# Patient Record
Sex: Male | Born: 1955
Health system: Southern US, Community
[De-identification: ages and names within clinical notes are randomized; demographics above are authoritative.]

## PROBLEM LIST (undated history)

## (undated) DIAGNOSIS — E119 Type 2 diabetes mellitus without complications: Secondary | ICD-10-CM

## (undated) DIAGNOSIS — G4730 Sleep apnea, unspecified: Secondary | ICD-10-CM

## (undated) DIAGNOSIS — S83289A Other tear of lateral meniscus, current injury, unspecified knee, initial encounter: Secondary | ICD-10-CM

## (undated) DIAGNOSIS — M1990 Unspecified osteoarthritis, unspecified site: Secondary | ICD-10-CM

## (undated) DIAGNOSIS — S83249A Other tear of medial meniscus, current injury, unspecified knee, initial encounter: Secondary | ICD-10-CM

## (undated) DIAGNOSIS — Z972 Presence of dental prosthetic device (complete) (partial): Secondary | ICD-10-CM

## (undated) HISTORY — PX: TONSILLECTOMY: SUR1361

## (undated) HISTORY — DX: Other tear of lateral meniscus, current injury, unspecified knee, initial encounter: S83.289A

## (undated) HISTORY — DX: Other tear of medial meniscus, current injury, unspecified knee, initial encounter: S83.249A

---

## 2000-07-30 HISTORY — PX: NASAL SEPTOPLASTY W/ TURBINOPLASTY: SHX2070

## 2000-11-06 ENCOUNTER — Ambulatory Visit (HOSPITAL_COMMUNITY): Admission: RE | Admit: 2000-11-06 | Discharge: 2000-11-07 | Payer: Self-pay | Admitting: *Deleted

## 2000-11-06 ENCOUNTER — Encounter (INDEPENDENT_AMBULATORY_CARE_PROVIDER_SITE_OTHER): Payer: Self-pay | Admitting: *Deleted

## 2001-01-10 ENCOUNTER — Encounter: Payer: Self-pay | Admitting: *Deleted

## 2001-01-10 ENCOUNTER — Emergency Department (HOSPITAL_COMMUNITY): Admission: EM | Admit: 2001-01-10 | Discharge: 2001-01-10 | Payer: Self-pay | Admitting: *Deleted

## 2001-01-12 ENCOUNTER — Emergency Department (HOSPITAL_COMMUNITY): Admission: EM | Admit: 2001-01-12 | Discharge: 2001-01-12 | Payer: Self-pay | Admitting: Internal Medicine

## 2001-01-21 ENCOUNTER — Emergency Department (HOSPITAL_COMMUNITY): Admission: EM | Admit: 2001-01-21 | Discharge: 2001-01-21 | Payer: Self-pay | Admitting: *Deleted

## 2002-09-15 ENCOUNTER — Emergency Department (HOSPITAL_COMMUNITY): Admission: EM | Admit: 2002-09-15 | Discharge: 2002-09-15 | Payer: Self-pay | Admitting: *Deleted

## 2009-09-22 ENCOUNTER — Ambulatory Visit (HOSPITAL_COMMUNITY): Admission: RE | Admit: 2009-09-22 | Discharge: 2009-09-22 | Payer: Self-pay | Admitting: Internal Medicine

## 2010-12-15 NOTE — Op Note (Signed)
Joshua Hardy. [facility]  Patient:    Joshua Hardy, Joshua Hardy                         MRN: [id number] Proc. Date: 11/06/00 Adm. Date:  [id number] Attending:  Carlena Sax CC:         Barbaraann Share, M.D. [facility]  Dr. Carylon Perches   Operative Report  PREOPERATIVE DIAGNOSIS:  Severe obstructive sleep apnea, nasal airway obstruction, nasal septal deviation and inferior turbinate hypertrophy.  POSTOPERATIVE DIAGNOSIS:  Severe obstructive sleep apnea, nasal airway obstruction, nasal septal deviation and inferior turbinate hypertrophy.  OPERATION PERFORMED:  Nasal septal reconstruction, intramural cauterization of both inferior turbinates.  SURGEON:  Veverly Fells. Arletha Grippe, M.D.  ANESTHESIA:  General endotracheal.  INDICATIONS FOR PROCEDURE:  The patient is a 55 year old white male who gives a long history of nasal airway obstruction, daytime somnolence, morning fatigue, loud snoring at night with apneic episodes as noted by his wife. Inpatient polysomnogram did reveal severe obstructive sleep apnea, RDI 76 events per hour.  He was seen by Dr. Marcelyn Bruins and has undergone nasal CPAP treatment for his severe obstructive sleep apnea.  Unfortunately due to his significant problem with nasal anatomy, he does have difficulty breathing through his nose and is unable to use the nasal CPAP effectively.  Physical examination did show severe shape septal deformity, inferior turbinate congestion and hypertrophy.  Based on his history and physical examination and the above noted anatomical findings, I have recommended proceeding with the above noted surgical procedure.  I have discussed with him extensively, the risks and benefits of surgery including risks of general anesthesia, infection and bleeding, the possibility of septal perforation and the normal recovery period after this type of surgery.  I have entertained any questions, answered them appropriately.  Informed consent was  obtained and the patient presents for the above noted procedure.  OPERATIVE FINDINGS:  A severe S-shaped septal deformity and inferior turbinate congestion and hypertrophy.  DESCRIPTION OF PROCEDURE:  The patient was brought to the operating room and placed in supine position.  General endotracheal anesthesia was administered via the anesthesiologist without complication.  The patient was administered 1 gm of Ancef IV x 1 and 10 mg Decadron IV x 1.  Head of the table was elevated to 30 degrees.  The patients face was draped in standard fashion. Cotton pledgets soaked in a 4% cocaine solution were placed on both nares and left in place for approximately 10 minutes and then removed.  Both sides of the septum were infiltrated with a 1% lidocaine solution with 1:100,000 epinephrine waiting approximately 10 minutes and then a standard Killian incision was made on the left side of the septum.  Mucoperichondrium and mucoperiosteal flap was elevated on the left side using both sharp and blunt dissection.  Next the intercartilaginous incision was made approximately 1 cm posterior from the caudal end of the septum.  The mucoperichondrial and mucoperiosteal flap was elevated on the right side using both blunt and sharp dissection.  Cartilaginous deviation was removed with a Ballenger swivel knife.  The posterior bony deflection was removed with a Jansen-Middleton and the septal spur which was pushing off from the left nasal chamber was removed using a small osteotome.  A piece of trimmed morselized cartilage was placed in between the septal flaps.  The septal incision was closed with interrupted chromic suture and the septum was reinforced with a running transeptal plain gut mattress stitch.  Both  inferior turbinates were injected with a total of 6 cc of a 1% lidocaine solution with 1:100,000 epinephrine.  Both inferior turbinates were then intramurally cauterized using Elmed  intramural cauterization unit set on a 12 watt setting.  Three passes both inferior turbinates were performed without difficulty and both inferior turbinates were then outfractured using gentle lateral pressure with a large nasal speculum. Doyle splints soaked in a Bactroban ointment solution were placed on either side of the septum and held in place with a transeptal Prolene suture.  The oral cavity was suctioned dry.  There was no evidence of any active bleeding. Fluids given during the procedure were approximately 1L crystalloid. Estimated blood loss was less than 50 cc.  Urine output not measured.  No drains.  The above noted two Doyle splints were placed.  Specimens sent were septal cartilage and bone for gross pathology only.  The patient tolerated the procedure well without complications and was extubated in the operating room and transferred to the recovery room in stable condition.  Sponge and instrument counts were correct at the end of the procedure. Total duration of the procedure was approximately one hour.  The patient would be admitted overnight in a monitored situation.  If he has recovered well, he will be sent home on November 07, 2000.  He will be sent home on some Keflex 500 mg p.o. t.i.d. for 10 days and Vicodin #30 with two refills 1 to 2 tabs p.o. q.4h. p.r.n. pain.  He is to have light activity, no heavy lifting or nose blowing for two weeks after surgery.  Both he and his family were given oral and written instructions.  They are to call with any problems with bleeding, fever, vomiting, pain or reaction to medications.  He will follow up in the office for splint removal on Wednesday April 17 at 2:25 p.m. DD:  11/06/00 TD:  11/06/00 Job: [id number]

## 2013-06-24 ENCOUNTER — Other Ambulatory Visit (HOSPITAL_COMMUNITY): Payer: Self-pay | Admitting: Orthopedic Surgery

## 2013-06-24 DIAGNOSIS — M25562 Pain in left knee: Secondary | ICD-10-CM

## 2013-06-29 ENCOUNTER — Ambulatory Visit (HOSPITAL_COMMUNITY)
Admission: RE | Admit: 2013-06-29 | Discharge: 2013-06-29 | Disposition: A | Discharge status: Home / Self Care | Payer: PRIVATE HEALTH INSURANCE | Source: Ambulatory Visit | Attending: Orthopedic Surgery | Admitting: Orthopedic Surgery

## 2013-06-29 ENCOUNTER — Other Ambulatory Visit (HOSPITAL_COMMUNITY): Payer: Self-pay | Admitting: Orthopedic Surgery

## 2013-06-29 DIAGNOSIS — M25469 Effusion, unspecified knee: Secondary | ICD-10-CM | POA: Insufficient documentation

## 2013-06-29 DIAGNOSIS — M25562 Pain in left knee: Secondary | ICD-10-CM

## 2013-06-29 DIAGNOSIS — M25569 Pain in unspecified knee: Secondary | ICD-10-CM | POA: Insufficient documentation

## 2013-06-29 DIAGNOSIS — M6740 Ganglion, unspecified site: Secondary | ICD-10-CM | POA: Insufficient documentation

## 2013-06-29 DIAGNOSIS — M2240 Chondromalacia patellae, unspecified knee: Secondary | ICD-10-CM | POA: Insufficient documentation

## 2013-06-29 DIAGNOSIS — M23329 Other meniscus derangements, posterior horn of medial meniscus, unspecified knee: Secondary | ICD-10-CM | POA: Insufficient documentation

## 2013-08-04 ENCOUNTER — Encounter (HOSPITAL_BASED_OUTPATIENT_CLINIC_OR_DEPARTMENT_OTHER): Payer: Self-pay | Admitting: *Deleted

## 2013-08-04 NOTE — Progress Notes (Signed)
Had corrective surgery for sleep apnea-states he rarely snores, no apnea-denies any resp or cardiac problems

## 2013-08-04 NOTE — Progress Notes (Signed)
08/04/13 0932  OBSTRUCTIVE SLEEP APNEA  Have you ever been diagnosed with sleep apnea through a sleep study? Yes  If yes, do you have and use a CPAP or BPAP machine every night? 0 (had surgery)  Do you snore loudly (loud enough to be heard through closed doors)?  0  Do you often feel tired, fatigued, or sleepy during the daytime? 0  Has anyone observed you stop breathing during your sleep? 0  Do you have, or are you being treated for high blood pressure? 0  BMI more than 35 kg/m2? 1  Age over 56 years old? 1  Neck circumference greater than 40 cm/18 inches? 1  Gender: 1  Obstructive Sleep Apnea Score 4  Score 4 or greater  Results sent to PCP

## 2013-08-05 ENCOUNTER — Encounter (HOSPITAL_BASED_OUTPATIENT_CLINIC_OR_DEPARTMENT_OTHER)
Admission: RE | Disposition: A | Discharge status: Home / Self Care | Payer: Self-pay | Source: Ambulatory Visit | Attending: Orthopedic Surgery

## 2013-08-05 ENCOUNTER — Encounter: Payer: Self-pay | Admitting: Physician Assistant

## 2013-08-05 ENCOUNTER — Other Ambulatory Visit: Payer: Self-pay | Admitting: Physician Assistant

## 2013-08-05 ENCOUNTER — Encounter (HOSPITAL_BASED_OUTPATIENT_CLINIC_OR_DEPARTMENT_OTHER): Payer: Self-pay | Admitting: *Deleted

## 2013-08-05 ENCOUNTER — Ambulatory Visit (HOSPITAL_BASED_OUTPATIENT_CLINIC_OR_DEPARTMENT_OTHER): Payer: PRIVATE HEALTH INSURANCE | Admitting: *Deleted

## 2013-08-05 ENCOUNTER — Ambulatory Visit (HOSPITAL_BASED_OUTPATIENT_CLINIC_OR_DEPARTMENT_OTHER)
Admission: RE | Admit: 2013-08-05 | Discharge: 2013-08-06 | Disposition: A | Discharge status: Home / Self Care | Payer: PRIVATE HEALTH INSURANCE | Source: Ambulatory Visit | Attending: Orthopedic Surgery | Admitting: Orthopedic Surgery

## 2013-08-05 ENCOUNTER — Encounter (HOSPITAL_BASED_OUTPATIENT_CLINIC_OR_DEPARTMENT_OTHER): Payer: PRIVATE HEALTH INSURANCE | Admitting: *Deleted

## 2013-08-05 DIAGNOSIS — S83249A Other tear of medial meniscus, current injury, unspecified knee, initial encounter: Secondary | ICD-10-CM

## 2013-08-05 DIAGNOSIS — S83242D Other tear of medial meniscus, current injury, left knee, subsequent encounter: Secondary | ICD-10-CM

## 2013-08-05 DIAGNOSIS — S83289A Other tear of lateral meniscus, current injury, unspecified knee, initial encounter: Secondary | ICD-10-CM | POA: Insufficient documentation

## 2013-08-05 DIAGNOSIS — Z87891 Personal history of nicotine dependence: Secondary | ICD-10-CM | POA: Insufficient documentation

## 2013-08-05 DIAGNOSIS — G4730 Sleep apnea, unspecified: Secondary | ICD-10-CM

## 2013-08-05 DIAGNOSIS — M23329 Other meniscus derangements, posterior horn of medial meniscus, unspecified knee: Secondary | ICD-10-CM | POA: Insufficient documentation

## 2013-08-05 DIAGNOSIS — S83282D Other tear of lateral meniscus, current injury, left knee, subsequent encounter: Secondary | ICD-10-CM

## 2013-08-05 DIAGNOSIS — M23359 Other meniscus derangements, posterior horn of lateral meniscus, unspecified knee: Secondary | ICD-10-CM | POA: Insufficient documentation

## 2013-08-05 DIAGNOSIS — M9420 Chondromalacia, unspecified site: Secondary | ICD-10-CM | POA: Insufficient documentation

## 2013-08-05 DIAGNOSIS — Z972 Presence of dental prosthetic device (complete) (partial): Secondary | ICD-10-CM

## 2013-08-05 HISTORY — PX: KNEE ARTHROSCOPY WITH MEDIAL MENISECTOMY: SHX5651

## 2013-08-05 HISTORY — DX: Presence of dental prosthetic device (complete) (partial): Z97.2

## 2013-08-05 HISTORY — DX: Sleep apnea, unspecified: G47.30

## 2013-08-05 LAB — POCT HEMOGLOBIN-HEMACUE: HEMOGLOBIN: 13.9 g/dL (ref 13.0–17.0)

## 2013-08-05 SURGERY — ARTHROSCOPY, KNEE, WITH MEDIAL MENISCECTOMY
Anesthesia: Regional | Site: Knee | Laterality: Left

## 2013-08-05 MED ORDER — DEXTROSE 5 % IV SOLN
3.0000 g | INTRAVENOUS | Status: DC | PRN
  Administered 2013-08-05: 3 g via INTRAVENOUS

## 2013-08-05 MED ORDER — BUPIVACAINE-EPINEPHRINE PF 0.25-1:200000 % IJ SOLN
INTRAMUSCULAR | Status: AC
  Filled 2013-08-05: qty 30

## 2013-08-05 MED ORDER — DEXAMETHASONE SODIUM PHOSPHATE 10 MG/ML IJ SOLN
INTRAMUSCULAR | Status: DC | PRN
  Administered 2013-08-05: 10 mg via INTRAVENOUS

## 2013-08-05 MED ORDER — HYDROMORPHONE HCL PF 1 MG/ML IJ SOLN: 0.5000 mg | INTRAMUSCULAR | Status: DC | PRN

## 2013-08-05 MED ORDER — EPINEPHRINE HCL 1 MG/ML IJ SOLN
INTRAMUSCULAR | Status: DC | PRN
  Administered 2013-08-05: 13:00:00

## 2013-08-05 MED ORDER — EPINEPHRINE HCL 1 MG/ML IJ SOLN
INTRAMUSCULAR | Status: AC
  Filled 2013-08-05: qty 1

## 2013-08-05 MED ORDER — METOCLOPRAMIDE HCL 5 MG/ML IJ SOLN: 5.0000 mg | Freq: Three times a day (TID) | INTRAMUSCULAR | Status: DC | PRN

## 2013-08-05 MED ORDER — MIDAZOLAM HCL 2 MG/2ML IJ SOLN
INTRAMUSCULAR | Status: AC
  Filled 2013-08-05: qty 2

## 2013-08-05 MED ORDER — PROPOFOL 10 MG/ML IV BOLUS
INTRAVENOUS | Status: DC | PRN
  Administered 2013-08-05: 300 mg via INTRAVENOUS

## 2013-08-05 MED ORDER — SODIUM CHLORIDE 0.9 % IV SOLN
INTRAVENOUS | Status: DC
  Administered 2013-08-05: 16:00:00 via INTRAVENOUS

## 2013-08-05 MED ORDER — CEFAZOLIN SODIUM-DEXTROSE 2-3 GM-% IV SOLR
2.0000 g | Freq: Four times a day (QID) | INTRAVENOUS | Status: AC
  Administered 2013-08-05 – 2013-08-06 (×3): 2 g via INTRAVENOUS

## 2013-08-05 MED ORDER — OXYCODONE HCL 5 MG PO TABS: 5.0000 mg | ORAL_TABLET | Freq: Once | ORAL | Status: DC | PRN

## 2013-08-05 MED ORDER — CEFAZOLIN SODIUM 1-5 GM-% IV SOLN
INTRAVENOUS | Status: AC
  Filled 2013-08-05: qty 150

## 2013-08-05 MED ORDER — PROPOFOL 10 MG/ML IV EMUL
INTRAVENOUS | Status: AC
  Filled 2013-08-05: qty 50

## 2013-08-05 MED ORDER — METOCLOPRAMIDE HCL 5 MG PO TABS: 5.0000 mg | ORAL_TABLET | Freq: Three times a day (TID) | ORAL | Status: DC | PRN

## 2013-08-05 MED ORDER — ONDANSETRON HCL 4 MG/2ML IJ SOLN
4.0000 mg | Freq: Four times a day (QID) | INTRAMUSCULAR | Status: DC | PRN
Start: 2013-08-05 — End: 2013-08-06

## 2013-08-05 MED ORDER — HYDROCODONE-ACETAMINOPHEN 5-325 MG PO TABS: 1.0000 | ORAL_TABLET | ORAL | Status: DC | PRN

## 2013-08-05 MED ORDER — ONDANSETRON HCL 4 MG PO TABS
4.0000 mg | ORAL_TABLET | Freq: Four times a day (QID) | ORAL | Status: DC | PRN
Start: 2013-08-05 — End: 2013-08-06

## 2013-08-05 MED ORDER — MIDAZOLAM HCL 2 MG/2ML IJ SOLN
1.0000 mg | INTRAMUSCULAR | Status: DC | PRN
  Administered 2013-08-05: 2 mg via INTRAVENOUS

## 2013-08-05 MED ORDER — LACTATED RINGERS IV SOLN
INTRAVENOUS | Status: DC
  Administered 2013-08-05: 10:00:00 via INTRAVENOUS

## 2013-08-05 MED ORDER — HYDROMORPHONE HCL PF 1 MG/ML IJ SOLN
0.2500 mg | INTRAMUSCULAR | Status: DC | PRN
  Administered 2013-08-05 (×3): 0.5 mg via INTRAVENOUS
  Filled 2013-08-05 (×2): qty 1

## 2013-08-05 MED ORDER — FENTANYL CITRATE 0.05 MG/ML IJ SOLN
INTRAMUSCULAR | Status: AC
  Filled 2013-08-05: qty 4

## 2013-08-05 MED ORDER — ONDANSETRON HCL 4 MG/2ML IJ SOLN
INTRAMUSCULAR | Status: DC | PRN
  Administered 2013-08-05: 4 mg via INTRAVENOUS

## 2013-08-05 MED ORDER — CEFAZOLIN SODIUM 1-5 GM-% IV SOLN
INTRAVENOUS | Status: AC
  Filled 2013-08-05: qty 100

## 2013-08-05 MED ORDER — FENTANYL CITRATE 0.05 MG/ML IJ SOLN
INTRAMUSCULAR | Status: AC
  Filled 2013-08-05: qty 2

## 2013-08-05 MED ORDER — HYDROCODONE-ACETAMINOPHEN 5-325 MG PO TABS: ORAL_TABLET | ORAL | Status: DC

## 2013-08-05 MED ORDER — FENTANYL CITRATE 0.05 MG/ML IJ SOLN
50.0000 ug | INTRAMUSCULAR | Status: DC | PRN
  Administered 2013-08-05: 100 ug via INTRAVENOUS

## 2013-08-05 MED ORDER — LIDOCAINE HCL (CARDIAC) 20 MG/ML IV SOLN
INTRAVENOUS | Status: DC | PRN
  Administered 2013-08-05: 100 mg via INTRAVENOUS

## 2013-08-05 MED ORDER — CEFAZOLIN SODIUM 1-5 GM-% IV SOLN
INTRAVENOUS | Status: AC
  Filled 2013-08-05: qty 50

## 2013-08-05 MED ORDER — OXYCODONE HCL 5 MG/5ML PO SOLN: 5.0000 mg | Freq: Once | ORAL | Status: DC | PRN

## 2013-08-05 MED ORDER — METOCLOPRAMIDE HCL 5 MG/ML IJ SOLN: 10.0000 mg | Freq: Once | INTRAMUSCULAR | Status: DC | PRN

## 2013-08-05 SURGICAL SUPPLY — 43 items
BANDAGE ELASTIC 6 VELCRO ST LF (GAUZE/BANDAGES/DRESSINGS) ×2 IMPLANT
BLADE CUTTER GATOR 3.5 (BLADE) ×2 IMPLANT
BLADE GREAT WHITE 4.2 (BLADE) IMPLANT
BLADE SURG 15 STRL LF DISP TIS (BLADE) IMPLANT
BLADE SURG 15 STRL SS (BLADE)
BNDG COHESIVE 4X5 TAN STRL (GAUZE/BANDAGES/DRESSINGS) ×2 IMPLANT
CANISTER SUCT 3000ML (MISCELLANEOUS) IMPLANT
CANISTER SUCT LVC 12 LTR MEDI- (MISCELLANEOUS) IMPLANT
DRAPE ARTHROSCOPY W/POUCH 90 (DRAPES) ×2 IMPLANT
DURAPREP 26ML APPLICATOR (WOUND CARE) ×2 IMPLANT
GAUZE XEROFORM 1X8 LF (GAUZE/BANDAGES/DRESSINGS) ×2 IMPLANT
GLOVE BIO SURGEON STRL SZ7 (GLOVE) ×6 IMPLANT
GLOVE BIOGEL PI IND STRL 7.0 (GLOVE) ×2 IMPLANT
GLOVE BIOGEL PI IND STRL 7.5 (GLOVE) ×1 IMPLANT
GLOVE BIOGEL PI INDICATOR 7.0 (GLOVE) ×2
GLOVE BIOGEL PI INDICATOR 7.5 (GLOVE) ×1
GLOVE EXAM NITRILE MD LF STRL (GLOVE) ×2 IMPLANT
GLOVE SS BIOGEL STRL SZ 7.5 (GLOVE) ×1 IMPLANT
GLOVE SUPERSENSE BIOGEL SZ 7.5 (GLOVE) ×1
GOWN STRL REUS W/ TWL LRG LVL3 (GOWN DISPOSABLE) ×3 IMPLANT
GOWN STRL REUS W/TWL 2XL LVL3 (GOWN DISPOSABLE) ×2 IMPLANT
GOWN STRL REUS W/TWL LRG LVL3 (GOWN DISPOSABLE) ×3
HOLDER KNEE FOAM BLUE (MISCELLANEOUS) ×2 IMPLANT
KNEE WRAP E Z 3 GEL PACK (MISCELLANEOUS) ×2 IMPLANT
NDL SAFETY ECLIPSE 18X1.5 (NEEDLE) ×2 IMPLANT
NEEDLE HYPO 18GX1.5 SHARP (NEEDLE) ×2
NEEDLE HYPO 22GX1.5 SAFETY (NEEDLE) IMPLANT
PACK ARTHROSCOPY DSU (CUSTOM PROCEDURE TRAY) ×2 IMPLANT
PACK BASIN DAY SURGERY FS (CUSTOM PROCEDURE TRAY) ×2 IMPLANT
PAD ALCOHOL SWAB (MISCELLANEOUS) IMPLANT
SET ARTHROSCOPY TUBING (MISCELLANEOUS) ×1
SET ARTHROSCOPY TUBING LN (MISCELLANEOUS) ×1 IMPLANT
SPONGE GAUZE 4X4 12PLY (GAUZE/BANDAGES/DRESSINGS) ×2 IMPLANT
SUCTION FRAZIER TIP 10 FR DISP (SUCTIONS) IMPLANT
SUT ETHILON 4 0 PS 2 18 (SUTURE) ×2 IMPLANT
SUT PROLENE 3 0 PS 2 (SUTURE) IMPLANT
SUT VIC AB 3-0 PS1 18 (SUTURE) ×1
SUT VIC AB 3-0 PS1 18XBRD (SUTURE) ×1 IMPLANT
SYR 20CC LL (SYRINGE) IMPLANT
SYR 5ML LL (SYRINGE) ×2 IMPLANT
TOWEL OR 17X24 6PK STRL BLUE (TOWEL DISPOSABLE) ×2 IMPLANT
WAND STAR VAC 90 (SURGICAL WAND) IMPLANT
WATER STERILE IRR 1000ML POUR (IV SOLUTION) ×2 IMPLANT

## 2013-08-05 NOTE — Transfer of Care (Signed)
Immediate Anesthesia Transfer of Care Note  Patient: Joshua Hardy  Procedure(s) Performed: Procedure(s): LEFT KNEE ARTHROSCOPY WITH MEDIAL AND LATERAL MENISECTOMY (Left)  Patient Location: PACU  Anesthesia Type:GA combined with regional for post-op pain  Level of Consciousness: awake, alert  and oriented  Airway & Oxygen Therapy: Patient Spontanous Breathing and Patient connected to face mask oxygen  Post-op Assessment: Report given to PACU RN, Post -op Vital signs reviewed and stable and Patient moving all extremities  Post vital signs: Reviewed and stable  Complications: No apparent anesthesia complications

## 2013-08-05 NOTE — Anesthesia Preprocedure Evaluation (Signed)
Anesthesia Evaluation  Patient identified by MRN, date of birth, ID band Patient awake    Reviewed: Allergy & Precautions, H&P , NPO status , Patient's Chart, lab work & pertinent test results, reviewed documented beta blocker date and time   Airway Mallampati: II TM Distance: >3 FB Neck ROM: full    Dental   Pulmonary sleep apnea , former smoker,  breath sounds clear to auscultation        Cardiovascular negative cardio ROS  Rhythm:regular     Neuro/Psych negative neurological ROS  negative psych ROS   GI/Hepatic negative GI ROS, Neg liver ROS,   Endo/Other  Morbid obesity  Renal/GU negative Renal ROS  negative genitourinary   Musculoskeletal   Abdominal   Peds  Hematology negative hematology ROS (+)   Anesthesia Other Findings See surgeon's H&P   Reproductive/Obstetrics negative OB ROS                           Anesthesia Physical Anesthesia Plan  ASA: III  Anesthesia Plan: General   Post-op Pain Management:    Induction: Intravenous  Airway Management Planned: LMA  Additional Equipment:   Intra-op Plan:   Post-operative Plan:   Informed Consent: I have reviewed the patients History and Physical, chart, labs and discussed the procedure including the risks, benefits and alternatives for the proposed anesthesia with the patient or authorized representative who has indicated his/her understanding and acceptance.   Dental Advisory Given  Plan Discussed with: CRNA and Surgeon  Anesthesia Plan Comments:         Anesthesia Quick Evaluation

## 2013-08-05 NOTE — Progress Notes (Signed)
Assisted Dr. Gelene MinkFrederick with left, knee block. Side rails up, monitors on throughout procedure. See vital signs in flow sheet. Tolerated Procedure well.

## 2013-08-05 NOTE — Anesthesia Postprocedure Evaluation (Signed)
  Anesthesia Post-op Note  Patient: Joshua Hardy  Procedure(s) Performed: Procedure(s): LEFT KNEE ARTHROSCOPY WITH MEDIAL AND LATERAL MENISECTOMY (Left)  Patient Location: PACU  Anesthesia Type:General  Level of Consciousness: awake  Airway and Oxygen Therapy: Patient Spontanous Breathing and Patient connected to nasal cannula oxygen  Post-op Pain: mild  Post-op Assessment: Post-op Vital signs reviewed, Patient's Cardiovascular Status Stable, RESPIRATORY FUNCTION UNSTABLE, Patent Airway, No signs of Nausea or vomiting, Adequate PO intake and Pain level controlled  Post-op Vital Signs: Reviewed and stable  Complications: No apparent anesthesia complications and respiratory complications  Patient has h/o OSA that was surgically treated but still demonstrates s/sx of significant OSA with desaturations below 80 when resting. Will admit for supplemental oxygen tx and have CPAP initiated. It is possible that he may be able to be d/c later tonight but for now needs SAO2 monitoring. CFrederick, MD

## 2013-08-05 NOTE — Discharge Instructions (Signed)
Regional Anesthesia Blocks ° °1. Numbness or the inability to move the "blocked" extremity may last from 3-48 hours after placement. The length of time depends on the medication injected and your individual response to the medication. If the numbness is not going away after 48 hours, call your surgeon. ° °2. The extremity that is blocked will need to be protected until the numbness is gone and the  Strength has returned. Because you cannot feel it, you will need to take extra care to avoid injury. Because it may be weak, you may have difficulty moving it or using it. You may not know what position it is in without looking at it while the block is in effect. ° °3. For blocks in the legs and feet, returning to weight bearing and walking needs to be done carefully. You will need to wait until the numbness is entirely gone and the strength has returned. You should be able to move your leg and foot normally before you try and bear weight or walk. You will need someone to be with you when you first try to ensure you do not fall and possibly risk injury. ° °4. Bruising and tenderness at the needle site are common side effects and will resolve in a few days. ° °5. Persistent numbness or new problems with movement should be communicated to the surgeon or the [facility] ([phone])/ [facility] ([phone]). ° ° ° °Post Anesthesia Home Care Instructions ° °Activity: °Get plenty of rest for the remainder of the day. A responsible adult should stay with you for 24 hours following the procedure.  °For the next 24 hours, DO NOT: °-Drive a car °-Operate machinery °-Drink alcoholic beverages °-Take any medication unless instructed by your physician °-Make any legal decisions or sign important papers. ° °Meals: °Start with liquid foods such as gelatin or soup. Progress to regular foods as tolerated. Avoid greasy, spicy, heavy foods. If nausea and/or vomiting occur, drink only clear liquids until the  nausea and/or vomiting subsides. Call your physician if vomiting continues. ° °Special Instructions/Symptoms: °Your throat may feel dry or sore from the anesthesia or the breathing tube placed in your throat during surgery. If this causes discomfort, gargle with warm salt water. The discomfort should disappear within 24 hours. ° °

## 2013-08-05 NOTE — H&P (Signed)
Joshua RyderMark A Hardy is an 58 y.o. male.   Chief Complaint: left knee pin HPI: 57 yom twisted his knee a few weeks ago.  He felt a pop and has had pain ever since.  He has failed an intra-articular cortisone injection and has an MRI that confirms medial and lateral meniscus tears.    Past Medical History  Diagnosis Date  . Sleep apnea     did use cpap-had corrective surgery 02-no more apnea  . Wears partial dentures     top partial  . Medial meniscus tear   . Lateral meniscus tear     Past Surgical History  Procedure Laterality Date  . Nasal septoplasty w/ turbinoplasty  2002  . Tonsillectomy      No family history on file. Social History:  reports that he quit smoking about 12 years ago. He does not have any smokeless tobacco history on file. He reports that he drinks alcohol. He reports that he does not use illicit drugs.  Allergies: No Known Allergies  Meds:   NONE  (Not in a hospital admission)  Results for orders placed during the hospital encounter of 08/05/13 (from the past 48 hour(s))  POCT HEMOGLOBIN-HEMACUE     Status: None   Collection Time    08/05/13 10:30 AM      Result Value Range   Hemoglobin 13.9  13.0 - 17.0 g/dL   No results found.  Review of Systems  Constitutional: Negative.   HENT: Negative.   Eyes: Negative.   Respiratory: Negative.   Cardiovascular: Negative.   Gastrointestinal: Negative.   Genitourinary: Negative.   Musculoskeletal: Positive for joint pain.       Left knee pain  Skin: Negative.   Neurological: Negative.   Psychiatric/Behavioral: Negative.      Physical Exam  Constitutional: He is oriented to person, place, and time. He appears well-developed and well-nourished.  HENT:  Head: Normocephalic and atraumatic.  Eyes: Conjunctivae are normal. Pupils are equal, round, and reactive to light.  Neck: Neck supple.  Cardiovascular: Normal rate.   Respiratory: Effort normal.  GI: Soft.  Genitourinary:  Not pertinent to current  symptomatology therefore not examined.  Neurological: He is alert and oriented to person, place, and time.  Skin: Skin is warm and dry.  Psychiatric: He has a normal mood and affect. His behavior is normal.     Assessment Patient Active Problem List   Diagnosis Date Noted  . Sleep apnea   . Wears partial dentures   . Medial meniscus tear   . Lateral meniscus tear    Plan Left knee arthroscopy with partial medial and lateral meniscectomies.  The risks, benefits, and possible complications of the procedure were discussed in detail with the patient.  The patient is without question.  Marcanthony Sleight J 08/05/2013, 11:04 AM

## 2013-08-05 NOTE — H&P (View-Only) (Signed)
Joshua Hardy is an 58 y.o. male.   Chief Complaint: left knee pin HPI: 58 yom twisted his knee a few weeks ago.  He felt a pop and has had pain ever since.  He has failed an intra-articular cortisone injection and has an MRI that confirms medial and lateral meniscus tears.    Past Medical History  Diagnosis Date  . Sleep apnea     did use cpap-had corrective surgery 02-no more apnea  . Wears partial dentures     top partial  . Medial meniscus tear   . Lateral meniscus tear     Past Surgical History  Procedure Laterality Date  . Nasal septoplasty w/ turbinoplasty  2002  . Tonsillectomy      No family history on file. Social History:  reports that he quit smoking about 12 years ago. He does not have any smokeless tobacco history on file. He reports that he drinks alcohol. He reports that he does not use illicit drugs.  Allergies: No Known Allergies  Meds:   NONE  (Not in a hospital admission)  Results for orders placed during the hospital encounter of 08/05/13 (from the past 48 hour(s))  POCT HEMOGLOBIN-HEMACUE     Status: None   Collection Time    08/05/13 10:30 AM      Result Value Range   Hemoglobin 13.9  13.0 - 17.0 g/dL   No results found.  Review of Systems  Constitutional: Negative.   HENT: Negative.   Eyes: Negative.   Respiratory: Negative.   Cardiovascular: Negative.   Gastrointestinal: Negative.   Genitourinary: Negative.   Musculoskeletal: Positive for joint pain.       Left knee pain  Skin: Negative.   Neurological: Negative.   Psychiatric/Behavioral: Negative.      Physical Exam  Constitutional: He is oriented to person, place, and time. He appears well-developed and well-nourished.  HENT:  Head: Normocephalic and atraumatic.  Eyes: Conjunctivae are normal. Pupils are equal, round, and reactive to light.  Neck: Neck supple.  Cardiovascular: Normal rate.   Respiratory: Effort normal.  GI: Soft.  Genitourinary:  Not pertinent to current  symptomatology therefore not examined.  Neurological: He is alert and oriented to person, place, and time.  Skin: Skin is warm and dry.  Psychiatric: He has a normal mood and affect. His behavior is normal.     Assessment Patient Active Problem List   Diagnosis Date Noted  . Sleep apnea   . Wears partial dentures   . Medial meniscus tear   . Lateral meniscus tear    Plan Left knee arthroscopy with partial medial and lateral meniscectomies.  The risks, benefits, and possible complications of the procedure were discussed in detail with the patient.  The patient is without question.  Katalaya Beel J 08/05/2013, 11:04 AM    

## 2013-08-05 NOTE — Anesthesia Postprocedure Evaluation (Deleted)
Anesthesia Post Note  Patient: Joshua Hardy  Procedure(s) Performed: Procedure(s) (LRB): LEFT KNEE ARTHROSCOPY WITH MEDIAL AND LATERAL MENISECTOMY (Left)  Anesthesia type: General  Patient location: PACU  Post pain: Pain level controlled  Post assessment: Patient's Cardiovascular Status Stable  Last Vitals:  Filed Vitals:   08/05/13 1345  BP: 100/58  Pulse: 61  Temp:   Resp: 17    Post vital signs: Reviewed and stable  Level of consciousness: alert  Complications: No apparent anesthesia complications

## 2013-08-05 NOTE — Anesthesia Procedure Notes (Signed)
Procedure Name: LMA Insertion Date/Time: 08/05/2013 12:20 PM Performed by: Salvatore MarvelWAINER, ROBERT A Pre-anesthesia Checklist: Patient identified, Emergency Drugs available, Suction available and Patient being monitored Patient Re-evaluated:Patient Re-evaluated prior to inductionOxygen Delivery Method: Circle System Utilized Preoxygenation: Pre-oxygenation with 100% oxygen Intubation Type: IV induction Ventilation: Mask ventilation without difficulty LMA: LMA inserted LMA Size: 5.0 Number of attempts: 1 Airway Equipment and Method: bite block Placement Confirmation: positive ETCO2 and breath sounds checked- equal and bilateral Tube secured with: Tape Dental Injury: Teeth and Oropharynx as per pre-operative assessment

## 2013-08-05 NOTE — Interval H&P Note (Signed)
History and Physical Interval Note:  08/05/2013 12:05 PM  Joshua Hardy  has presented today for surgery, with the diagnosis of Left knee medial and lateral mensicus tear  The various methods of treatment have been discussed with the patient and family. After consideration of risks, benefits and other options for treatment, the patient has consented to  Procedure(s): LEFT KNEE ARTHROSCOPY WITH MEDIAL AND LATERAL MENISECTOMY (Left) as a surgical intervention .  The patient's history has been reviewed, patient examined, no change in status, stable for surgery.  I have reviewed the patient's chart and labs.  Questions were answered to the patient's satisfaction.     Salvatore MarvelWAINER,Lariyah Shetterly A

## 2013-08-06 ENCOUNTER — Encounter (HOSPITAL_BASED_OUTPATIENT_CLINIC_OR_DEPARTMENT_OTHER): Payer: Self-pay | Admitting: Orthopedic Surgery

## 2013-08-06 MED ORDER — CEFAZOLIN SODIUM 1-5 GM-% IV SOLN
INTRAVENOUS | Status: AC
  Filled 2013-08-06: qty 100

## 2013-08-06 NOTE — Progress Notes (Signed)
0510: Pt continues to sleep soundly with snoring & drops of O2 sats to 90-94%. Has another episode of O2 sat dropping to 79% with immediate recovery to 94%.

## 2013-08-06 NOTE — Progress Notes (Signed)
Monitor continues to beep with low sat warnings, and sats noted to be in the 70's and 80's. The sound alerts him to take deep breaths and he has spontaneous return to high 90's to 100%. Snoring noted with low sats.

## 2013-08-06 NOTE — Progress Notes (Signed)
[id number]: Pt changes pattern from shallow to deep w/snoring. When deep, loud snoring, pt's heart rate increases to 75-85 and O2 sats drop to 89-94%l  One episode of drop to 79% sat w/immediate recovery to 93%.

## 2013-08-07 NOTE — Op Note (Signed)
NAMLorel Monaco:  Ozanich, Herold                  ACCOUNT NO.:  [id number]  MEDICAL RECORD NO.:  [id number]  LOCATION:                               FACILITY:  [facility]  PHYSICIAN:  Derrin Currey A. Thurston HoleWainer, M.D. DATE OF BIRTH:  12-Feb-1956  DATE OF PROCEDURE:  08/05/2013 DATE OF DISCHARGE:  08/06/2013                              OPERATIVE REPORT   PREOPERATIVE DIAGNOSIS:  Left knee medial and lateral meniscal tears.  POSTOPERATIVE DIAGNOSIS:  Left knee medial and lateral meniscal tears.  PROCEDURE:  Left knee EUA, followed by arthroscopic partial medial and lateral meniscectomies.  SURGEON:  Elana Almobert A. Thurston HoleWainer, MD  ASSISTANT:  Julien GirtKirstin Shepperson, PA-C  ANESTHESIA:  General.  OPERATIVE TIME:  Thirty minutes.  COMPLICATIONS:  None.  INDICATION FOR PROCEDURE:  [name], 58 year old gentleman who has had significant left knee pain for the past 4-6 months increasing in nature with exam and MRI documenting meniscal tearing.  He has failed multiple conservative modalities and is now to undergo arthroscopy.  DESCRIPTION:  [name] was brought to the operating room on August 05, 2013, after knee block was placed in holding room by Anesthesia.  He was placed on operative table in supine position.  He received antibiotics preoperatively for prophylaxis.  After being placed under general anesthesia, his left knee was examined.  He had full range of motion. Knee was stable.  Ligamentous exam with normal patellar tracking.  Left leg was prepped using sterile DuraPrep and draped using sterile technique.  Time-out procedure was called and the correct left knee identified.  Initially, through an anterolateral portal, the arthroscope with a pump attached was placed into an anteromedial portal, and arthroscopic probe was placed.  On initial inspection of medial compartment, he had grade 1 and 2 chondromalacia.  Medial meniscus showed tearing of the posterior medial horn of which 30% was resected back to a stable  rim.  Intercondylar notch inspected.  Anterior and posterior cruciate ligaments were normal.  Lateral compartment inspected.  The articular cartilage was normal.  Lateral meniscus showed tearing of the posterior and lateral horn of which 30% was resected back to a stable rim.  Patellofemoral joint articular cartilage was normal. The patella tracked normally.  Medial and lateral gutters were free of pathology.  After this done, it is felt that all pathology had been satisfactorily addressed.  The instruments were removed.  Portal was closed with 3-0 nylon suture.  Sterile dressings were applied and the patient was awakened and taken to recovery room in stable condition.  FOLLOWUP CARE:  Mr. Beverely PaceCheek will be followed as an outpatient on Norco for pain.  Seen back in the office in a week for sutures out and followup.     Lucciano Vitali A. Thurston HoleWainer, M.D.     RAW/MEDQ  D:  08/05/2013  T:  08/06/2013  Job:  [id number]

## 2013-10-17 ENCOUNTER — Encounter (HOSPITAL_COMMUNITY): Payer: Self-pay | Admitting: Emergency Medicine

## 2013-10-17 ENCOUNTER — Emergency Department (HOSPITAL_COMMUNITY): Payer: PRIVATE HEALTH INSURANCE

## 2013-10-17 ENCOUNTER — Emergency Department (HOSPITAL_COMMUNITY)
Admission: EM | Admit: 2013-10-17 | Discharge: 2013-10-17 | Disposition: A | Discharge status: Home / Self Care | Payer: PRIVATE HEALTH INSURANCE | Attending: Emergency Medicine | Admitting: Emergency Medicine

## 2013-10-17 DIAGNOSIS — R61 Generalized hyperhidrosis: Secondary | ICD-10-CM | POA: Insufficient documentation

## 2013-10-17 DIAGNOSIS — Z87442 Personal history of urinary calculi: Secondary | ICD-10-CM | POA: Insufficient documentation

## 2013-10-17 DIAGNOSIS — R110 Nausea: Secondary | ICD-10-CM | POA: Insufficient documentation

## 2013-10-17 DIAGNOSIS — Z87828 Personal history of other (healed) physical injury and trauma: Secondary | ICD-10-CM | POA: Insufficient documentation

## 2013-10-17 DIAGNOSIS — Z87891 Personal history of nicotine dependence: Secondary | ICD-10-CM | POA: Insufficient documentation

## 2013-10-17 DIAGNOSIS — N201 Calculus of ureter: Secondary | ICD-10-CM | POA: Insufficient documentation

## 2013-10-17 DIAGNOSIS — N23 Unspecified renal colic: Secondary | ICD-10-CM

## 2013-10-17 LAB — URINALYSIS, ROUTINE W REFLEX MICROSCOPIC
Bilirubin Urine: NEGATIVE
GLUCOSE, UA: NEGATIVE mg/dL
Hgb urine dipstick: NEGATIVE
KETONES UR: NEGATIVE mg/dL
LEUKOCYTES UA: NEGATIVE
Nitrite: NEGATIVE
Protein, ur: NEGATIVE mg/dL
Specific Gravity, Urine: 1.02 (ref 1.005–1.030)
UROBILINOGEN UA: 0.2 mg/dL (ref 0.0–1.0)
pH: 7.5 (ref 5.0–8.0)

## 2013-10-17 MED ORDER — HYDROCODONE-ACETAMINOPHEN 5-325 MG PO TABS: 1.0000 | ORAL_TABLET | ORAL | Status: DC | PRN

## 2013-10-17 MED ORDER — ONDANSETRON 8 MG PO TBDP
8.0000 mg | ORAL_TABLET | Freq: Once | ORAL | Status: AC
  Administered 2013-10-17: 8 mg via ORAL
  Filled 2013-10-17: qty 1

## 2013-10-17 MED ORDER — TAMSULOSIN HCL 0.4 MG PO CAPS: 0.4000 mg | ORAL_CAPSULE | Freq: Every day | ORAL | Status: DC

## 2013-10-17 MED ORDER — OXYCODONE-ACETAMINOPHEN 5-325 MG PO TABS
2.0000 | ORAL_TABLET | Freq: Once | ORAL | Status: AC
Start: 2013-10-17 — End: 2013-10-17
  Administered 2013-10-17: 2 via ORAL
  Filled 2013-10-17: qty 2

## 2013-10-17 MED ORDER — ONDANSETRON HCL 8 MG PO TABS: 8.0000 mg | ORAL_TABLET | Freq: Three times a day (TID) | ORAL | Status: DC | PRN

## 2013-10-17 NOTE — ED Notes (Signed)
Pt c/o right sided flank pain that radiates around to his right lower abdomen. Pt states he initially started hurting on Tuesday but had some oxycodone and they relieved his pain. Pt then began hurting again today around 3pm and had 2 pills left. Pt has been able to keep pain under control but is now out of pain meds. C/o some nausea

## 2013-10-17 NOTE — Discharge Instructions (Signed)
Kidney Stones Kidney stones (urolithiasis) are solid masses that form inside your kidneys. The intense pain is caused by the stone moving through the kidney, ureter, bladder, and urethra (urinary tract). When the stone moves, the ureter starts to spasm around the stone. The stone is usually passed in your pee (urine).  HOME CARE  Drink enough fluids to keep your pee clear or pale yellow. This helps to get the stone out.  Strain all pee through the provided strainer. Do not pee without peeing through the strainer, not even once. If you pee the stone out, catch it in the strainer. The stone may be as small as a grain of salt. Take this to your doctor. This will help your doctor figure out what you can do to try to prevent more kidney stones.  Only take medicine as told by your doctor.  Follow up with your doctor as told.  Get follow-up X-rays as told by your doctor. GET HELP IF: You have pain that gets worse even if you have been taking pain medicine. GET HELP RIGHT AWAY IF:   Your pain does not get better with medicine.  You have a fever or shaking chills.  Your pain increases and gets worse over 18 hours.  You have new belly (abdominal) pain.  You feel faint or pass out.  You are unable to pee. MAKE SURE YOU:   Understand these instructions.  Will watch your condition.  Will get help right away if you are not doing well or get worse. Document Released: 01/02/2008 Document Revised: 03/18/2013 Document Reviewed: 12/17/2012 [employer] Patient Information 2014 [employer].  

## 2013-10-17 NOTE — ED Provider Notes (Signed)
CSN: [id number]     Arrival date & time 10/17/13  0011 History  This chart was scribed for Joya Gaskins, MD by Ardelia Mems, ED Scribe. This patient was seen in room APA03/APA03 and the patient's care was started at 12:36 AM.   Chief Complaint  Patient presents with  . Flank Pain    Patient is a 58 y.o. male presenting with flank pain.  Flank Pain This is a new problem. The current episode started 2 days ago. The problem occurs rarely. The problem has been gradually worsening. Associated symptoms include abdominal pain (radiation from flank). Pertinent negatives include no chest pain and no shortness of breath. Nothing aggravates the symptoms. The symptoms are relieved by narcotics (positioning and Oxycodone). Treatments tried: Oxycodone. The treatment provided moderate relief.    HPI Comments: Joshua Hardy is a 58 y.o. male with a history of kidney stones (when he was 58 years old) who presents to the Emergency Department complaining of intermittent, worsening right flank pain that radiates to his right abdomen and groin onset 2 days ago. He states that his pain feels similar to when he had his kidney stone. He states that his pain is improved with positioning. He reports a pain severity of "8/10" currently. He reports associated diaphoresis and nausea, but denies vomiting. He denies any fall or injuries to have onset his pain. He reports that he has taken Oxycodone from a prior knee surgery with some relief of his pain. He denies SOB, chest pain, denies new weakness in his arms or legs   Past Medical History  Diagnosis Date  . Sleep apnea     did use cpap-had corrective surgery 02-no more apnea  . Wears partial dentures     top partial  . Medial meniscus tear   . Lateral meniscus tear    Past Surgical History  Procedure Laterality Date  . Nasal septoplasty w/ turbinoplasty  2002  . Tonsillectomy    . Knee arthroscopy with medial menisectomy Left 08/05/2013    Procedure: LEFT KNEE  ARTHROSCOPY WITH MEDIAL AND LATERAL MENISECTOMY;  Surgeon: Nilda Simmer, MD;  Location: [facility];  Service: Orthopedics;  Laterality: Left;   History reviewed. No pertinent family history. History  Substance Use Topics  . Smoking status: Former Smoker    Quit date: 08/04/2001  . Smokeless tobacco: Not on file  . Alcohol Use: Yes     Comment: occ    Review of Systems  Constitutional: Positive for diaphoresis.  Respiratory: Negative for shortness of breath.   Cardiovascular: Negative for chest pain.  Gastrointestinal: Positive for nausea and abdominal pain (radiation from flank). Negative for vomiting.  Genitourinary: Positive for flank pain (right).  Neurological: Negative for weakness.  All other systems reviewed and are negative.   Allergies  Review of patient's allergies indicates no known allergies.  Home Medications   Current Outpatient Rx  Name  Route  Sig  Dispense  Refill  . HYDROcodone-acetaminophen (NORCO) 5-325 MG per tablet      1-2 tablets every 4-6 hrs as needed for pain   40 tablet   0    Triage Vitals: BP 144/71  Pulse 57  Temp(Src) 98.4 F (36.9 C) (Oral)  Resp 22  Ht 5\' 8"  (1.727 m)  Wt 295 lb (133.811 kg)  BMI 44.86 kg/m2  SpO2 100%  Physical Exam  Nursing note and vitals reviewed. CONSTITUTIONAL: Well developed/well nourished HEAD: Normocephalic/atraumatic EYES: EOMI/PERRL ENMT: Mucous membranes moist NECK: supple no  meningeal signs SPINE:entire spine nontender CV: S1/S2 noted, no murmurs/rubs/gallops noted LUNGS: Lungs are clear to auscultation bilaterally, no apparent distress ABDOMEN: soft, nontender, no rebound or guarding, he is obese ON:GEXBMGU:right cva tenderness, no inguinal hernia, no scrotal tenderness NEURO: Pt is awake/alert, moves all extremitiesx4, pt is ambulatory, no focal weakness in lower extremities EXTREMITIES: pulses normal, full ROM SKIN: warm, color normal PSYCH: no abnormalities of mood noted  ED  Course  Procedures   DIAGNOSTIC STUDIES: Oxygen Saturation is 100% on RA, normal by my interpretation.    COORDINATION OF CARE: 12:40 AM- discussed plan to obtain diagnostic lab work and radiology. Will also order medications. Pt advised of plan for treatment and pt agrees..  Pt only wants oral pain meds  1:40 AM Pt improved We discussed CT findings Will start pain meds/zofran and flomax - we discussed side effects of flomax Stable for d/c home  Medications  oxyCODONE-acetaminophen (PERCOCET/ROXICET) 5-325 MG per tablet 2 tablet (not administered)  ondansetron (ZOFRAN-ODT) disintegrating tablet 8 mg (not administered)   Labs Review Labs Reviewed  URINALYSIS, ROUTINE W REFLEX MICROSCOPIC   Imaging Review Ct Abdomen Pelvis Wo Contrast  10/17/2013   CLINICAL DATA:  Right-sided flank pain  EXAM: CT ABDOMEN AND PELVIS WITHOUT CONTRAST  TECHNIQUE: Multidetector CT imaging of the abdomen and pelvis was performed following the standard protocol without intravenous contrast.  COMPARISON:  None.  FINDINGS: Renal: There is right renal edema and mild hydronephrosis and hydroureter. This secondary to obstructing calculus in the distal right ureter measuring 4 mm (image 70). This small calculus is at the S1 vertebral body level and faintly evident on the CT scout. No additional renal calculi. No bladder calculi.  Lung bases are clear. No focal hepatic lesion. The gallbladder, pancreas, spleen, adrenal glands are normal. The bowel and appendix are normal. Abdominal aorta is normal caliber.  No free fluid the pelvis. No pelvic or abdominal adenopathy. No aggressive osseous lesion. Cystic lesion in the pubic symphysis of the left ischium is likely benign.  IMPRESSION: 1. Mildly obstructing calculus within the distal right ureter.   Electronically Signed   By: Genevive BiStewart  Edmunds M.D.   On: 10/17/2013 01:16      MDM   Final diagnoses:  Ureteral colic  Right ureteral stone    Nursing notes including  past medical history and social history reviewed and considered in documentation Labs/vital reviewed and considered    I personally performed the services described in this documentation, which was scribed in my presence. The recorded information has been reviewed and is accurate.      Joya Gaskinsonald W Tayshun Gappa, MD 10/17/13 [phone]

## 2016-03-15 ENCOUNTER — Ambulatory Visit (HOSPITAL_COMMUNITY)
Admission: RE | Admit: 2016-03-15 | Discharge: 2016-03-15 | Disposition: A | Discharge status: Home / Self Care | Payer: BLUE CROSS/BLUE SHIELD | Source: Ambulatory Visit | Attending: Internal Medicine | Admitting: Internal Medicine

## 2016-03-15 ENCOUNTER — Other Ambulatory Visit (HOSPITAL_COMMUNITY): Payer: Self-pay | Admitting: Internal Medicine

## 2016-03-15 DIAGNOSIS — M1711 Unilateral primary osteoarthritis, right knee: Secondary | ICD-10-CM | POA: Insufficient documentation

## 2016-03-15 DIAGNOSIS — M25551 Pain in right hip: Secondary | ICD-10-CM | POA: Insufficient documentation

## 2016-03-15 DIAGNOSIS — M25561 Pain in right knee: Secondary | ICD-10-CM | POA: Insufficient documentation

## 2016-03-15 DIAGNOSIS — R52 Pain, unspecified: Secondary | ICD-10-CM

## 2016-12-19 ENCOUNTER — Other Ambulatory Visit (HOSPITAL_COMMUNITY): Payer: Self-pay | Admitting: Orthopedic Surgery

## 2016-12-19 DIAGNOSIS — M25561 Pain in right knee: Secondary | ICD-10-CM

## 2016-12-27 ENCOUNTER — Ambulatory Visit (HOSPITAL_COMMUNITY)
Admission: RE | Admit: 2016-12-27 | Discharge: 2016-12-27 | Disposition: A | Discharge status: Home / Self Care | Payer: BLUE CROSS/BLUE SHIELD | Source: Ambulatory Visit | Attending: Orthopedic Surgery | Admitting: Orthopedic Surgery

## 2016-12-27 DIAGNOSIS — M948X6 Other specified disorders of cartilage, lower leg: Secondary | ICD-10-CM | POA: Diagnosis not present

## 2016-12-27 DIAGNOSIS — S83241A Other tear of medial meniscus, current injury, right knee, initial encounter: Secondary | ICD-10-CM | POA: Insufficient documentation

## 2016-12-27 DIAGNOSIS — X58XXXA Exposure to other specified factors, initial encounter: Secondary | ICD-10-CM | POA: Insufficient documentation

## 2016-12-27 DIAGNOSIS — M25561 Pain in right knee: Secondary | ICD-10-CM | POA: Diagnosis present

## 2017-01-02 ENCOUNTER — Encounter: Payer: BLUE CROSS/BLUE SHIELD | Attending: Internal Medicine | Admitting: Nutrition

## 2017-01-02 VITALS — Ht 67.0 in | Wt 300.0 lb

## 2017-01-02 DIAGNOSIS — E118 Type 2 diabetes mellitus with unspecified complications: Secondary | ICD-10-CM

## 2017-01-02 DIAGNOSIS — E119 Type 2 diabetes mellitus without complications: Secondary | ICD-10-CM | POA: Insufficient documentation

## 2017-01-02 DIAGNOSIS — Z713 Dietary counseling and surveillance: Secondary | ICD-10-CM | POA: Insufficient documentation

## 2017-01-02 DIAGNOSIS — E1165 Type 2 diabetes mellitus with hyperglycemia: Secondary | ICD-10-CM

## 2017-01-02 DIAGNOSIS — IMO0002 Reserved for concepts with insufficient information to code with codable children: Secondary | ICD-10-CM

## 2017-01-02 NOTE — Patient Instructions (Signed)
Goals 1.  Follow the Plate Method 2.. Eat three meals per day 3.  Increase fresh fruits and vegetables. 4. Cut out processed foods; hot dogs, bacon, sausage 5. Drink water Exercise 30 minutes 5 times per day Get FBS less than 130 mg/dl and less than 161150 mg/dl before bed.

## 2017-01-03 NOTE — Progress Notes (Signed)
Diabetes Self-Management Education  Visit Type: First/Initial  Appt. Start Time: 0800 Appt. End Time: 930  01/03/2017  [name], identified by name and date of birth, is a 61 y.o. male with a diagnosis of Diabetes:  Marland Kitchen Here with his wife today. Just diagnosed with Type 2 DM. A1C 7.8%. Denies HTN and Hyperlipidemia. Has history of knee and back pain. Quit smoking 4 yrs ago and has gained 50+ lbs. Owns a [employer]. Admits to eating a lot of processed, high fat, high salt foods in the past and now trying to make better choices. Still eats processed meats-hot dogs at lunch at the local gas station store. Drinking only water now. Working on cutting out snacks and making better food choices. Is engaged to lose weight and improve his blood sugars to reduce complications from DM.  Diet is excessive in calories, protein, fat, sodium as evidenced by BMI >40, A1C 7.8%.    Good family support.   ASSESSMENT  Height 5\' 7"  (1.702 m), weight 300 lb (136.1 kg). Body mass index is 46.99 kg/m.      Diabetes Self-Management Education - 01/02/17 0811      Visit Information   Visit Type First/Initial     Initial Visit   Diabetes Type --   Are you currently following a meal plan? No   Are you taking your medications as prescribed? Yes   Date Diagnosed May 2018     Health Coping   How would you rate your overall health? Good     Psychosocial Assessment   Patient Belief/Attitude about Diabetes Motivated to manage diabetes   Self-care barriers None   Self-management support Family   Other persons present Patient;Spouse/SO   Patient Concerns Nutrition/Meal planning;Medication;Monitoring;Healthy Lifestyle;Weight Control   Special Needs None   Preferred Learning Style No preference indicated   Learning Readiness Change in progress   How often do you need to have someone help you when you read instructions, pamphlets, or other written materials from your doctor or pharmacy? 1 - Never   What  is the last grade level you completed in school? 12     Pre-Education Assessment   Patient understands the diabetes disease and treatment process. Needs Instruction   Patient understands incorporating nutritional management into lifestyle. Needs Instruction   Patient undertands incorporating physical activity into lifestyle. Needs Instruction   Patient understands using medications safely. Needs Instruction   Patient understands monitoring blood glucose, interpreting and using results Needs Instruction   Patient understands prevention, detection, and treatment of acute complications. Needs Instruction   Patient understands prevention, detection, and treatment of chronic complications. Needs Instruction   Patient understands how to develop strategies to address psychosocial issues. Needs Instruction   Patient understands how to develop strategies to promote health/change behavior. Needs Instruction     Complications   Last HgB A1C per patient/outside source 7.8 %   How often do you check your blood sugar? 1-2 times/day   Fasting Blood glucose range (mg/dL) 69-629   Postprandial Blood glucose range (mg/dL) 528-413   Number of hypoglycemic episodes per month 0   Number of hyperglycemic episodes per week 5   Can you tell when your blood sugar is high? Yes   What do you do if your blood sugar is high? nothing   Have you had a dilated eye exam in the past 12 months? No   Have you had a dental exam in the past 12 months? No   Are you  checking your feet? No     Dietary Intake   Breakfast 2 slices toast and PB  or eggs, bacon and toast on weekends, coffee with splenda, milk   Snack (morning) quit eating cookies and drink   Lunch Hot dog with chili and onions and bun, water   Dinner Tacos- 3, sf cookies,  water   Snack (evening) apple   Beverage(s) water, crystal light     Exercise   Exercise Type ADL's     Patient Education   Previous Diabetes Education No   Disease state  Definition of  diabetes, type 1 and 2, and the diagnosis of diabetes;Factors that contribute to the development of diabetes   Nutrition management  Role of diet in the treatment of diabetes and the relationship between the three main macronutrients and blood glucose level;Food label reading, portion sizes and measuring food.;Carbohydrate counting;Meal timing in regards to the patients' current diabetes medication.;Information on hints to eating out and maintain blood glucose control.   Physical activity and exercise  Role of exercise on diabetes management, blood pressure control and cardiac health.;Identified with patient nutritional and/or medication changes necessary with exercise.   Medications Reviewed patients medication for diabetes, action, purpose, timing of dose and side effects.   Monitoring Taught/discussed recording of test results and interpretation of SMBG.;Interpreting lab values - A1C, lipid, urine microalbumina.   Acute complications Taught treatment of hypoglycemia - the 15 rule.;Discussed and identified patients' treatment of hyperglycemia.   Chronic complications Relationship between chronic complications and blood glucose control;Assessed and discussed foot care and prevention of foot problems;Lipid levels, blood glucose control and heart disease;Identified and discussed with patient  current chronic complications;Nephropathy, what it is, prevention of, the use of ACE, ARB's and early detection of through urine microalbumia.   Psychosocial adjustment Worked with patient to identify barriers to care and solutions;Role of stress on diabetes;Identified and addressed patients feelings and concerns about diabetes   Personal strategies to promote health Lifestyle issues that need to be addressed for better diabetes care     Individualized Goals (developed by patient)   Nutrition General guidelines for healthy choices and portions discussed;Follow meal plan discussed;Adjust meds/carbs with exercise as  discussed   Physical Activity Exercise 3-5 times per week;30 minutes per day   Medications take my medication as prescribed   Monitoring  test my blood glucose as discussed   Reducing Risk examine blood glucose patterns;do foot checks daily;get labs drawn     Post-Education Assessment   Patient understands the diabetes disease and treatment process. Needs Review   Patient understands incorporating nutritional management into lifestyle. Needs Review   Patient undertands incorporating physical activity into lifestyle. Needs Review   Patient understands using medications safely. Demonstrates understanding / competency   Patient understands monitoring blood glucose, interpreting and using results Demonstrates understanding / competency   Patient understands prevention, detection, and treatment of acute complications. Needs Review   Patient understands prevention, detection, and treatment of chronic complications. Needs Review   Patient understands how to develop strategies to address psychosocial issues. Needs Review   Patient understands how to develop strategies to promote health/change behavior. Needs Review     Outcomes   Expected Outcomes Demonstrated interest in learning. Expect positive outcomes   Future DMSE 4-6 wks   Program Status Completed      Individualized Plan for Diabetes Self-Management Training:   Learning Objective:  Patient will have a greater understanding of diabetes self-management. Patient education plan is to attend individual and/or  group sessions per assessed needs and concerns.   Plan:   Patient Instructions  Goals 1.  Follow the Plate Method 2.. Eat three meals per day 3.  Increase fresh fruits and vegetables. 4. Cut out processed foods; hot dogs, bacon, sausage 5. Drink water Exercise 30 minutes 5 times per day Get FBS less than 130 mg/dl and less than 161 mg/dl before bed.    Expected Outcomes:  Demonstrated interest in learning. Expect positive  outcomes  Education material provided: Living Well with Diabetes, Food label handouts, A1C conversion sheet, Meal plan card, My Plate and Carbohydrate counting sheet  If problems or questions, patient to contact team via:  Phone and Email  Future DSME appointment: 4-6 wks

## 2017-02-06 ENCOUNTER — Encounter: Payer: BLUE CROSS/BLUE SHIELD | Attending: Internal Medicine | Admitting: Nutrition

## 2017-02-06 VITALS — Wt 296.0 lb

## 2017-02-06 DIAGNOSIS — E119 Type 2 diabetes mellitus without complications: Secondary | ICD-10-CM | POA: Diagnosis not present

## 2017-02-06 DIAGNOSIS — IMO0002 Reserved for concepts with insufficient information to code with codable children: Secondary | ICD-10-CM

## 2017-02-06 DIAGNOSIS — Z713 Dietary counseling and surveillance: Secondary | ICD-10-CM | POA: Diagnosis not present

## 2017-02-06 DIAGNOSIS — E1165 Type 2 diabetes mellitus with hyperglycemia: Secondary | ICD-10-CM

## 2017-02-06 DIAGNOSIS — E118 Type 2 diabetes mellitus with unspecified complications: Secondary | ICD-10-CM

## 2017-02-06 NOTE — Progress Notes (Signed)
Diabetes Self-Management Education  Visit Type:    Appt. Start Time: 0800 Appt. End Time: 930  02/06/2017  [name], identified by name and date of birth, is a 61 y.o. male with a diagnosis of Diabetes Type 2.   Brought BS meter/logs. 14 day 125 mg/dl 30 day 409 mg/dl. Estimated A1C may be around 6%. Goes to see Dr. Ouida Hardy in August/Septemeber 2018 for follow up and labs. Taking Metformin 500 mg BID. Lost 4  Lbs. Changes made: cut out sweets, eating more fruit or vegetables. Drinking mostly water. Cut out snacks between meals. He is watching portions and trying to eat earlier.   Hasn't been able to work in walking yet for exercise. He does a lot of walking as a Curator.    Diet is improving slowly. BS are much better.    ASSESSMENT  Weight 296 lb (134.3 kg). Body mass index is 46.36 kg/m.       Diabetes Self-Management Education - 02/06/17 0809      Health Coping   How would you rate your overall health? Good     Pre-Education Assessment   Patient understands the diabetes disease and treatment process. Needs Review   Patient understands incorporating nutritional management into lifestyle. Needs Review   Patient undertands incorporating physical activity into lifestyle. Needs Review   Patient understands using medications safely. Demonstrates understanding / competency   Patient understands monitoring blood glucose, interpreting and using results Demonstrates understanding / competency   Patient understands prevention, detection, and treatment of acute complications. Demonstrates understanding / competency   Patient understands prevention, detection, and treatment of chronic complications. Needs Review   Patient understands how to develop strategies to address psychosocial issues. Needs Review   Patient understands how to develop strategies to promote health/change behavior. Needs Review     Complications   How often do you check your blood sugar? 1-2 times/day   Fasting  Blood glucose range (mg/dL) 81-191   Postprandial Blood glucose range (mg/dL) 478-295   Number of hypoglycemic episodes per month 0   Number of hyperglycemic episodes per week 0   Can you tell when your blood sugar is high? Yes   What do you do if your blood sugar is high? Drink water     Dietary Intake   Breakfast 2 slices toast with PB and coffee   Snack (morning) Nabs, Diet Dt Dew   Lunch Cheese dog with onion and chili on bun, water usually has been eating more salads and fruit   Dinner Tomato soup and grilled cheese, water; usually eating baked or grilled meats and vegetables, water   Beverage(s) water, crystal light     Exercise   Exercise Type Light (walking / raking leaves)   How many days per week to you exercise? 3   How many minutes per day do you exercise? 15   Total minutes per week of exercise 45     Patient Education   Nutrition management  Meal timing in regards to the patients' current diabetes medication.;Information on hints to eating out and maintain blood glucose control.;Meal options for control of blood glucose level and chronic complications.;Role of diet in the treatment of diabetes and the relationship between the three main macronutrients and blood glucose level   Physical activity and exercise  Role of exercise on diabetes management, blood pressure control and cardiac health.;Identified with patient nutritional and/or medication changes necessary with exercise.   Monitoring Taught/discussed recording of test results and interpretation of SMBG.;Identified  appropriate SMBG and/or A1C goals.;Daily foot exams;Yearly dilated eye exam   Psychosocial adjustment Role of stress on diabetes;Brainstormed with patient on coping mechanisms for social situations, getting support from significant others, dealing with feelings about diabetes   Personal strategies to promote health Lifestyle issues that need to be addressed for better diabetes care     Individualized Goals  (developed by patient)   Nutrition Follow meal plan discussed;General guidelines for healthy choices and portions discussed;Adjust meds/carbs with exercise as discussed   Physical Activity Exercise 3-5 times per week;30 minutes per day   Medications take my medication as prescribed   Monitoring  test my blood glucose as discussed   Reducing Risk examine blood glucose patterns;get labs drawn;increase portions of healthy fats     Patient Self-Evaluation of Goals - Patient rates self as meeting previously set goals (% of time)   Nutrition >75%   Physical Activity 25 - 50%   Medications >75%   Monitoring >75%   Problem Solving >75%   Reducing Risk >75%     Post-Education Assessment   Patient understands the diabetes disease and treatment process. Demonstrates understanding / competency   Patient understands incorporating nutritional management into lifestyle. Needs Review   Patient undertands incorporating physical activity into lifestyle. Needs Review   Patient understands using medications safely. Demonstrates understanding / competency   Patient understands monitoring blood glucose, interpreting and using results Demonstrates understanding / competency   Patient understands prevention, detection, and treatment of acute complications. Demonstrates understanding / competency   Patient understands prevention, detection, and treatment of chronic complications. Demonstrates understanding / competency   Patient understands how to develop strategies to address psychosocial issues. Demonstrates understanding / competency   Patient understands how to develop strategies to promote health/change behavior. Demonstrates understanding / competency     Outcomes   Program Status Completed     Subsequent Visit   Since your last visit have you had your blood pressure checked? Yes   Is your most recent blood pressure lower, unchanged, or higher since your last visit? Lower   Since your last visit have  you experienced any weight changes? Loss   Weight Loss (lbs) 4   Since your last visit, are you checking your blood glucose at least once a day? Yes      Learning Objective:  Patient will have a greater understanding of diabetes self-management. Patient education plan is to attend individual and/or group sessions per assessed needs and concerns.   Plan:   Patient Instructions  Goals 1.Continue to increasing fresh fruits and vegetables. 2. Keep drinking water 3. Keep losing 1 lb per week 4.  Cut out diet sodas and salty snacks 5. Get A1C down to 6%    Expected Outcomes:  Demonstrated interest in learning. Expect positive outcomes  Education material provided: My Plate  If problems or questions, patient to contact team via:  Phone and Email  Future DSME appointment: - 3-4 months

## 2017-02-06 NOTE — Patient Instructions (Addendum)
Goals 1.Continue to increasing fresh fruits and vegetables. 2. Keep drinking water 3. Keep losing 1 lb per week 4.  Cut out diet soidas and salty snacks 5. Get A1C down to 6%

## 2017-05-09 ENCOUNTER — Ambulatory Visit: Payer: BLUE CROSS/BLUE SHIELD | Admitting: Nutrition

## 2018-02-06 ENCOUNTER — Encounter: Payer: Self-pay | Admitting: General Surgery

## 2018-02-06 ENCOUNTER — Encounter (INDEPENDENT_AMBULATORY_CARE_PROVIDER_SITE_OTHER): Payer: Self-pay

## 2018-02-06 ENCOUNTER — Ambulatory Visit: Payer: BLUE CROSS/BLUE SHIELD | Admitting: General Surgery

## 2018-02-06 VITALS — BP 165/76 | HR 58 | Temp 97.7°F | Resp 22 | Wt 300.0 lb

## 2018-02-06 DIAGNOSIS — Z1211 Encounter for screening for malignant neoplasm of colon: Secondary | ICD-10-CM | POA: Diagnosis not present

## 2018-02-06 MED ORDER — PEG 3350-KCL-NABCB-NACL-NASULF 236 G PO SOLR: 4000.0000 mL | Freq: Once | ORAL | 0 refills | Status: AC

## 2018-02-06 NOTE — Progress Notes (Signed)
Joshua Hardy; [id number]; 12/10/1955   HPI Patient is a 61-year-old white male who was referred to my care by Dr. Fagan for a screening colonoscopy.  Patient has never had a colonoscopy.  He denies any family history of colon cancer, abnormal diarrhea or constipation, weight loss, abdominal pain, or blood per rectum.  He currently has 0 out of 10 abdominal pain. Past Medical History:  Diagnosis Date  . Lateral meniscus tear   . Medial meniscus tear   . Sleep apnea    did use cpap-had corrective surgery 02-no more apnea  . Wears partial dentures    top partial    Past Surgical History:  Procedure Laterality Date  . KNEE ARTHROSCOPY WITH MEDIAL MENISECTOMY Left 08/05/2013   Procedure: LEFT KNEE ARTHROSCOPY WITH MEDIAL AND LATERAL MENISECTOMY;  Surgeon: Robert A Wainer, MD;  Location: [facility];  Service: Orthopedics;  Laterality: Left;  . NASAL SEPTOPLASTY W/ TURBINOPLASTY  2002  . TONSILLECTOMY      History reviewed. No pertinent family history.  Current Outpatient Medications on File Prior to Visit  Medication Sig Dispense Refill  . atorvastatin (LIPITOR) 20 MG tablet Take 20 mg by mouth daily.    . metFORMIN (GLUCOPHAGE) 500 MG tablet Take 500 mg by mouth 2 (two) times daily with a meal.     No current facility-administered medications on file prior to visit.     No Known Allergies  Social History   Substance and Sexual Activity  Alcohol Use Yes   Comment: occ    Social History   Tobacco Use  Smoking Status Former Smoker  . Last attempt to quit: 08/04/2001  . Years since quitting: 16.5  Smokeless Tobacco Never Used    Review of Systems  Constitutional: Negative.   HENT: Negative.   Eyes: Negative.   Respiratory: Negative.   Cardiovascular: Negative.   Gastrointestinal: Negative.   Genitourinary: Negative.   Musculoskeletal: Positive for joint pain.  Skin: Negative.   Neurological: Negative.   Endo/Heme/Allergies: Negative.    Psychiatric/Behavioral: Negative.     Objective   Vitals:   02/06/18 0921  BP: (!) 165/76  Pulse: (!) 58  Resp: (!) 22  Temp: 97.7 F (36.5 C)    Physical Exam  Constitutional: He is oriented to person, place, and time. He appears well-developed and well-nourished. No distress.  HENT:  Head: Normocephalic and atraumatic.  Cardiovascular: Normal rate, regular rhythm and normal heart sounds. Exam reveals no gallop and no friction rub.  No murmur heard. Pulmonary/Chest: Effort normal and breath sounds normal. No stridor. No respiratory distress. He has no wheezes. He has no rales.  Abdominal: Soft. Bowel sounds are normal. He exhibits no distension and no mass. There is no tenderness. There is no guarding.  Neurological: He is alert and oriented to person, place, and time.  Skin: Skin is warm and dry.  Vitals reviewed.  Dr. Fagan's notes reviewed Assessment  Need for screening colonoscopy Plan   Patient is scheduled for a screening colonoscopy on 03/11/2018.  The risks and benefits of the procedure including bleeding and perforation were fully explained to the patient, who gave informed consent.  GoLYTELY prep has been prescribed.  

## 2018-02-06 NOTE — Patient Instructions (Signed)
Colonoscopy, Adult A colonoscopy is an exam to look at the entire large intestine. During the exam, a lubricated, bendable tube is inserted into the anus and then passed into the rectum, colon, and other parts of the large intestine. A colonoscopy is often done as a part of normal colorectal screening or in response to certain symptoms, such as anemia, persistent diarrhea, abdominal pain, and blood in the stool. The exam can help screen for and diagnose medical problems, including:  Tumors.  Polyps.  Inflammation.  Areas of bleeding.  Tell a health care provider about:  Any allergies you have.  All medicines you are taking, including vitamins, herbs, eye drops, creams, and over-the-counter medicines.  Any problems you or family members have had with anesthetic medicines.  Any blood disorders you have.  Any surgeries you have had.  Any medical conditions you have.  Any problems you have had passing stool. What are the risks? Generally, this is a safe procedure. However, problems may occur, including:  Bleeding.  A tear in the intestine.  A reaction to medicines given during the exam.  Infection (rare).  What happens before the procedure? Eating and drinking restrictions Follow instructions from your health care provider about eating and drinking, which may include:  A few days before the procedure - follow a low-fiber diet. Avoid nuts, seeds, dried fruit, raw fruits, and vegetables.  1-3 days before the procedure - follow a clear liquid diet. Drink only clear liquids, such as clear broth or bouillon, black coffee or tea, clear juice, clear soft drinks or sports drinks, gelatin dessert, and popsicles. Avoid any liquids that contain red or purple dye.  On the day of the procedure - do not eat or drink anything during the 2 hours before the procedure, or within the time period that your health care provider recommends.  Bowel prep If you were prescribed an oral bowel prep  to clean out your colon:  Take it as told by your health care provider. Starting the day before your procedure, you will need to drink a large amount of medicated liquid. The liquid will cause you to have multiple loose stools until your stool is almost clear or light green.  If your skin or anus gets irritated from diarrhea, you may use these to relieve the irritation: ? Medicated wipes, such as adult wet wipes with aloe and vitamin E. ? A skin soothing-product like petroleum jelly.  If you vomit while drinking the bowel prep, take a break for up to 60 minutes and then begin the bowel prep again. If vomiting continues and you cannot take the bowel prep without vomiting, call your health care provider.  General instructions  Ask your health care provider about changing or stopping your regular medicines. This is especially important if you are taking diabetes medicines or blood thinners.  Plan to have someone take you home from the hospital or clinic. What happens during the procedure?  An IV tube may be inserted into one of your veins.  You will be given medicine to help you relax (sedative).  To reduce your risk of infection: ? Your health care team will wash or sanitize their hands. ? Your anal area will be washed with soap.  You will be asked to lie on your side with your knees bent.  Your health care provider will lubricate a long, thin, flexible tube. The tube will have a camera and a light on the end.  The tube will be inserted into your   anus.  The tube will be gently eased through your rectum and colon.  Air will be delivered into your colon to keep it open. You may feel some pressure or cramping.  The camera will be used to take images during the procedure.  A small tissue sample may be removed from your body to be examined under a microscope (biopsy). If any potential problems are found, the tissue will be sent to a lab for testing.  If small polyps are found, your  health care provider may remove them and have them checked for cancer cells.  The tube that was inserted into your anus will be slowly removed. The procedure may vary among health care providers and hospitals. What happens after the procedure?  Your blood pressure, heart rate, breathing rate, and blood oxygen level will be monitored until the medicines you were given have worn off.  Do not drive for 24 hours after the exam.  You may have a small amount of blood in your stool.  You may pass gas and have mild abdominal cramping or bloating due to the air that was used to inflate your colon during the exam.  It is up to you to get the results of your procedure. Ask your health care provider, or the department performing the procedure, when your results will be ready. This information is not intended to replace advice given to you by your health care provider. Make sure you discuss any questions you have with your health care provider. Document Released: 07/13/2000 Document Revised: 05/16/2016 Document Reviewed: 09/27/2015 Elsevier Interactive Patient Education  2018 Elsevier Inc.  

## 2018-02-12 NOTE — H&P (Signed)
Joshua RyderMark A Cerino; [id number]; 03-Oct-1955   HPI Patient is a 62 year old white male who was referred to my care by Dr. Ouida SillsFagan for a screening colonoscopy.  Patient has never had a colonoscopy.  He denies any family history of colon cancer, abnormal diarrhea or constipation, weight loss, abdominal pain, or blood per rectum.  He currently has 0 out of 10 abdominal pain. Past Medical History:  Diagnosis Date  . Lateral meniscus tear   . Medial meniscus tear   . Sleep apnea    did use cpap-had corrective surgery 02-no more apnea  . Wears partial dentures    top partial    Past Surgical History:  Procedure Laterality Date  . KNEE ARTHROSCOPY WITH MEDIAL MENISECTOMY Left 08/05/2013   Procedure: LEFT KNEE ARTHROSCOPY WITH MEDIAL AND LATERAL MENISECTOMY;  Surgeon: Nilda Simmerobert A Wainer, MD;  Location: [facility];  Service: Orthopedics;  Laterality: Left;  . NASAL SEPTOPLASTY W/ TURBINOPLASTY  2002  . TONSILLECTOMY      History reviewed. No pertinent family history.  Current Outpatient Medications on File Prior to Visit  Medication Sig Dispense Refill  . atorvastatin (LIPITOR) 20 MG tablet Take 20 mg by mouth daily.    . metFORMIN (GLUCOPHAGE) 500 MG tablet Take 500 mg by mouth 2 (two) times daily with a meal.     No current facility-administered medications on file prior to visit.     No Known Allergies  Social History   Substance and Sexual Activity  Alcohol Use Yes   Comment: occ    Social History   Tobacco Use  Smoking Status Former Smoker  . Last attempt to quit: 08/04/2001  . Years since quitting: 16.5  Smokeless Tobacco Never Used    Review of Systems  Constitutional: Negative.   HENT: Negative.   Eyes: Negative.   Respiratory: Negative.   Cardiovascular: Negative.   Gastrointestinal: Negative.   Genitourinary: Negative.   Musculoskeletal: Positive for joint pain.  Skin: Negative.   Neurological: Negative.   Endo/Heme/Allergies: Negative.    Psychiatric/Behavioral: Negative.     Objective   Vitals:   02/06/18 0921  BP: (!) 165/76  Pulse: (!) 58  Resp: (!) 22  Temp: 97.7 F (36.5 C)    Physical Exam  Constitutional: He is oriented to person, place, and time. He appears well-developed and well-nourished. No distress.  HENT:  Head: Normocephalic and atraumatic.  Cardiovascular: Normal rate, regular rhythm and normal heart sounds. Exam reveals no gallop and no friction rub.  No murmur heard. Pulmonary/Chest: Effort normal and breath sounds normal. No stridor. No respiratory distress. He has no wheezes. He has no rales.  Abdominal: Soft. Bowel sounds are normal. He exhibits no distension and no mass. There is no tenderness. There is no guarding.  Neurological: He is alert and oriented to person, place, and time.  Skin: Skin is warm and dry.  Vitals reviewed.  Dr. Alonza SmokerFagan's notes reviewed Assessment  Need for screening colonoscopy Plan   Patient is scheduled for a screening colonoscopy on 03/11/2018.  The risks and benefits of the procedure including bleeding and perforation were fully explained to the patient, who gave informed consent.  GoLYTELY prep has been prescribed.

## 2018-03-01 IMAGING — MR MR KNEE*R* W/O CM
4 of 7 series · 13 of 40 positions shown · non-contrast
Comparison: None.

CLINICAL DATA: Right knee pain for 3 months

EXAM:
MRI OF THE RIGHT KNEE WITHOUT CONTRAST
TECHNIQUE: Multiplanar, multisequence MR imaging of the knee was performed. No
intravenous contrast was administered.

[Series 3: pdfs axial · axial · 3.0mm · 0.22mm/px · z∈[-18,+68]mm · 3 of 30 slices shown]
[im 6/30]
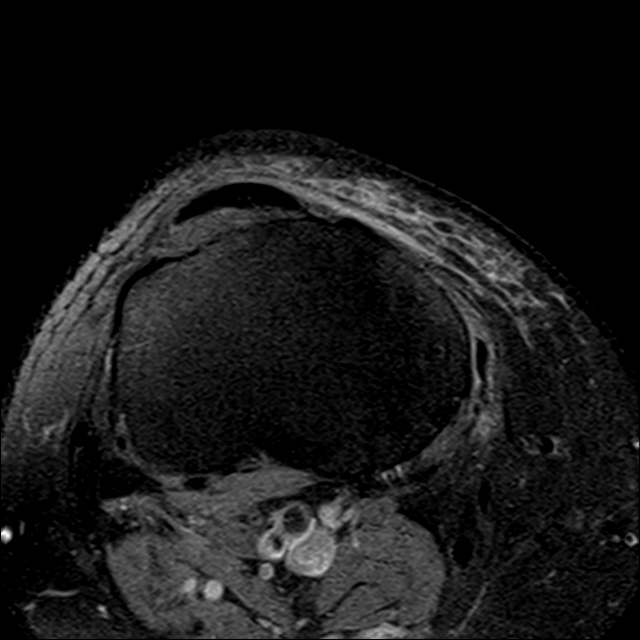
[im 18/30]
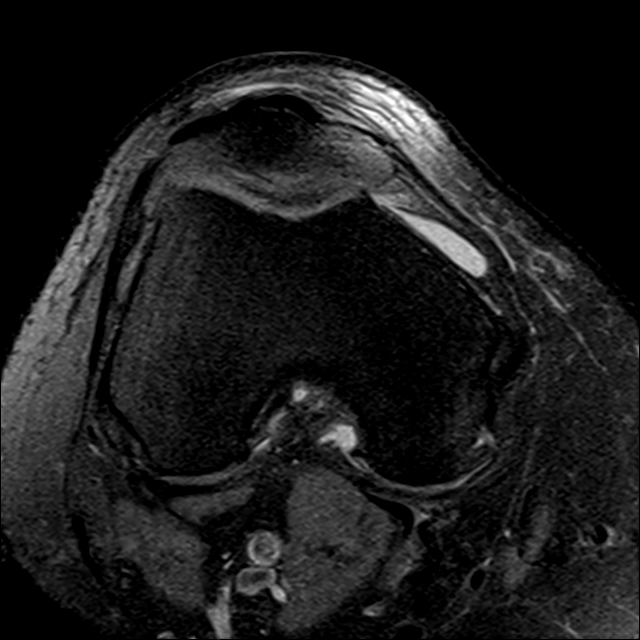
[im 30/30]
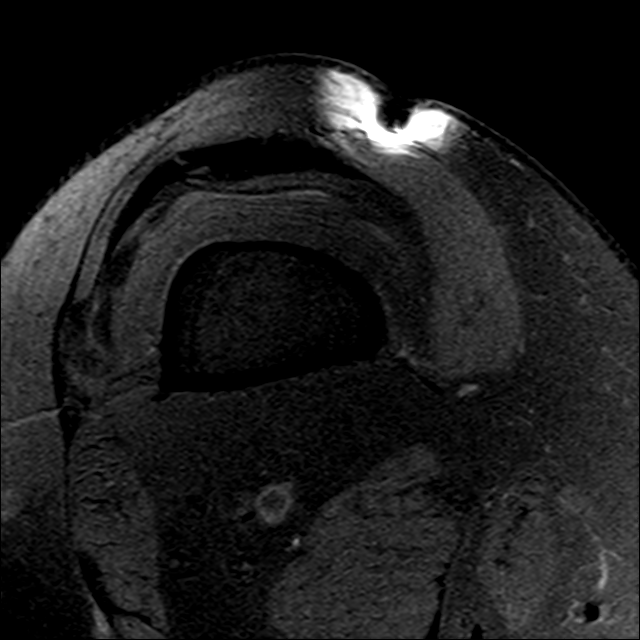

[Series 6: T1 · coronal · 3.0mm · 0.25mm/px · 4 of 26 slices shown]
[im 1/26]
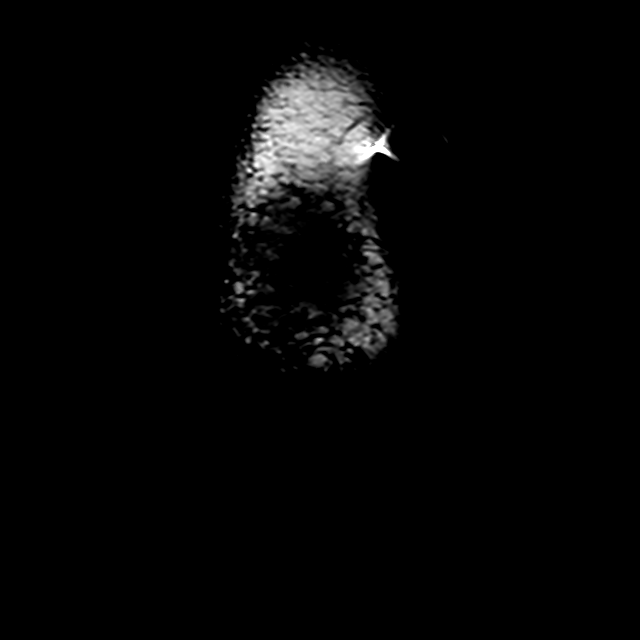
[im 6/26]
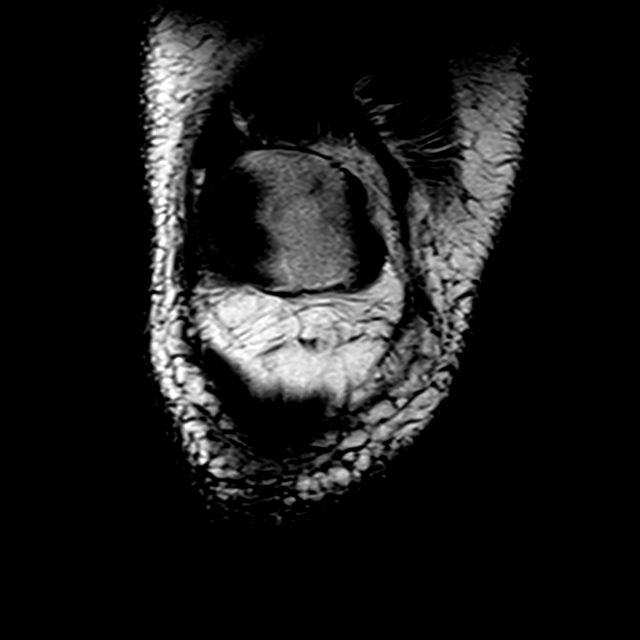
[im 16/26]
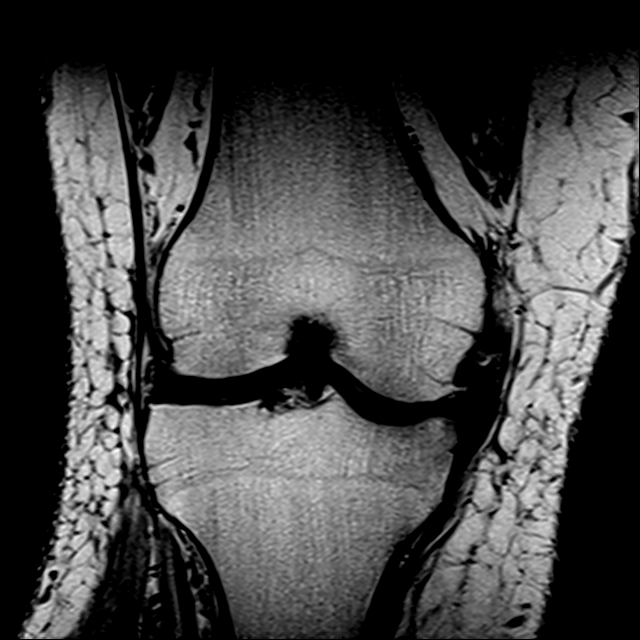
[im 26/26]
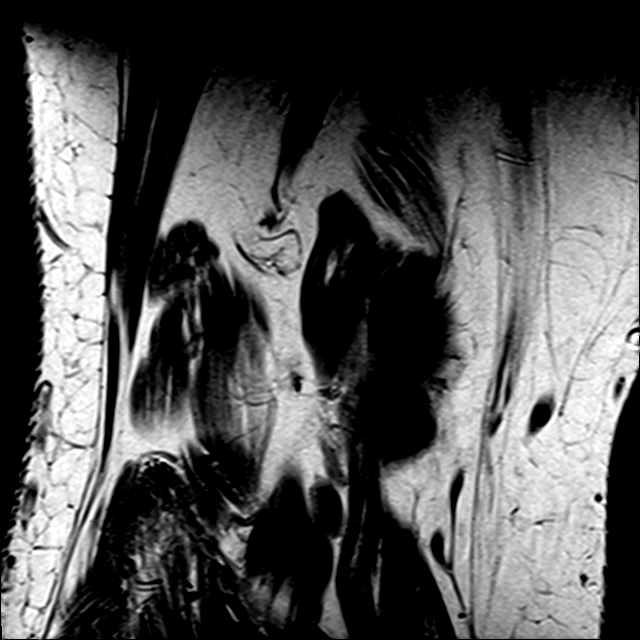

[Series 7: t2fs cor · coronal · 3.0mm · 0.25mm/px · 3 of 26 slices shown]
[im 6/26]
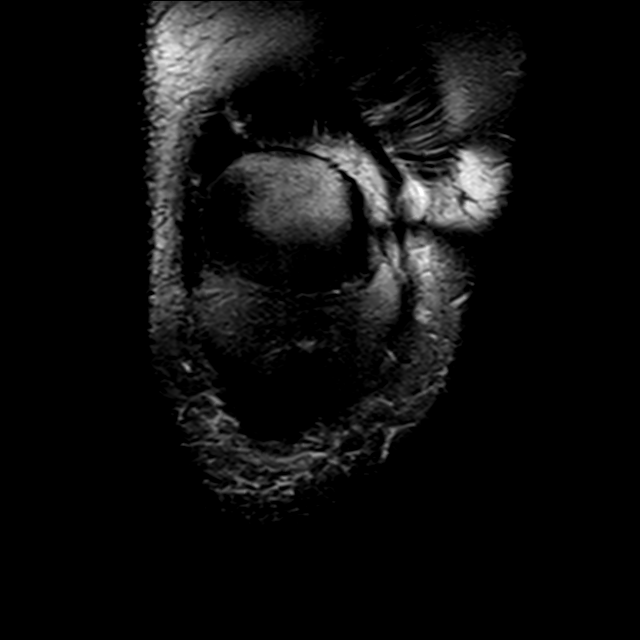
[im 16/26]
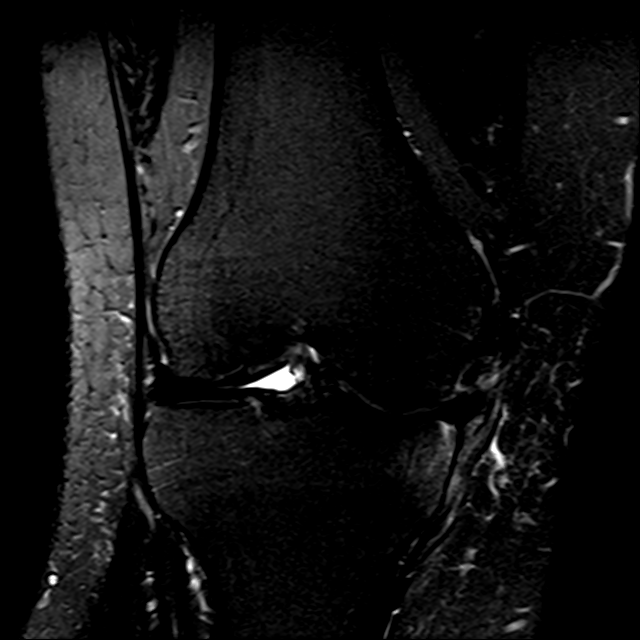
[im 26/26]
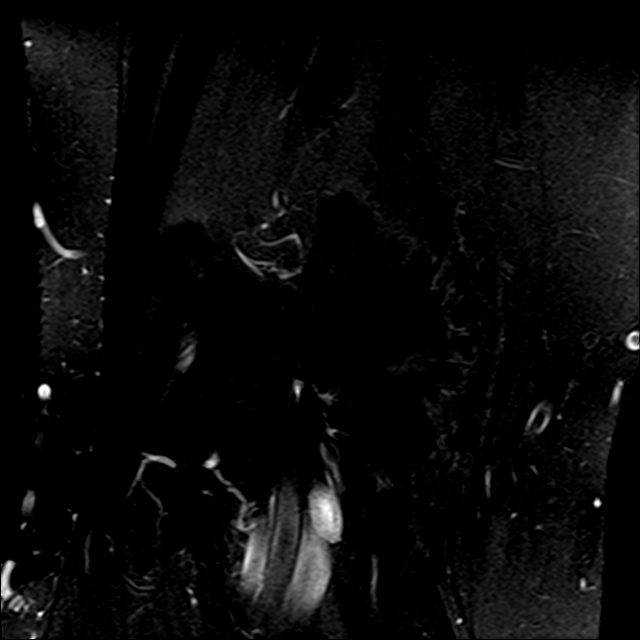

[Series 8: pdfs sag · sagittal · 3.0mm · 0.25mm/px · 3 of 29 slices shown]
[im 6/29]
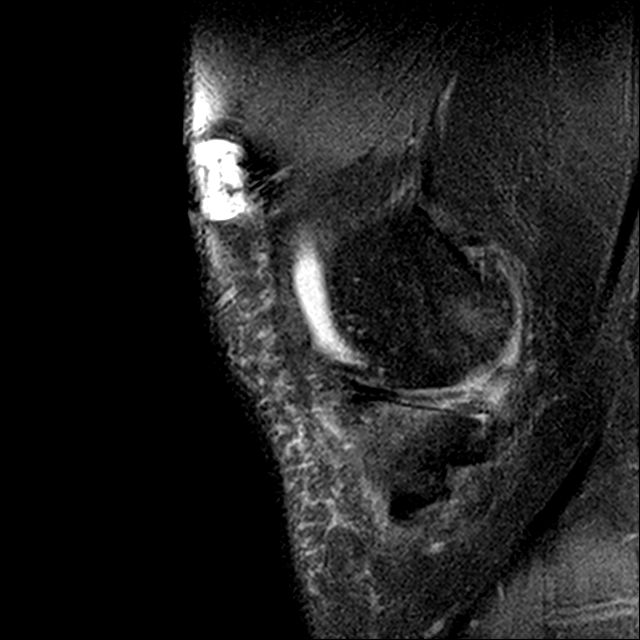
[im 17/29]
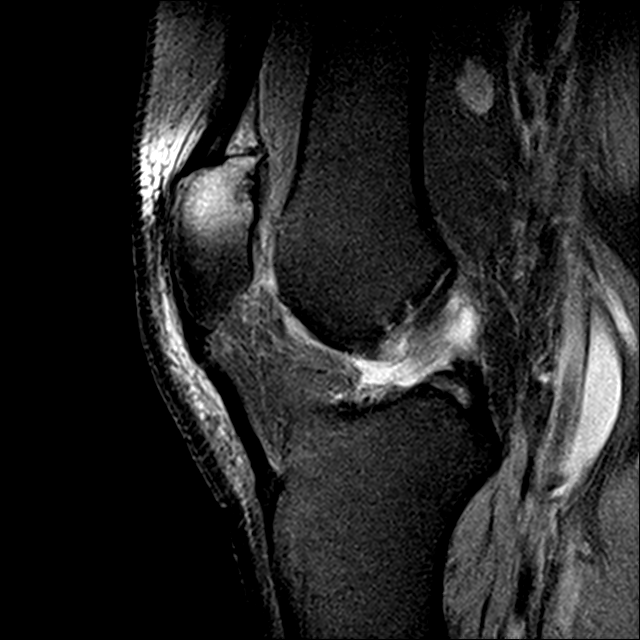
[im 29/29]
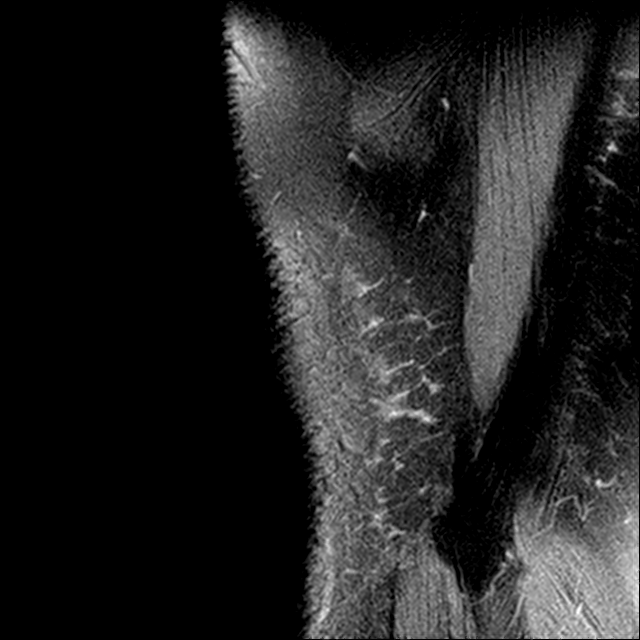

[13 of 40 positions shown; findings below may reference images not displayed]

FINDINGS: MENISCI

Medial meniscus: Complex tear of the posterior horn and body of the
medial meniscus with a radial component.

Lateral meniscus:  Intact.

LIGAMENTS

Cruciates:  Intact ACL and PCL.

Collaterals: Medial collateral ligament is intact. Lateral
collateral ligament complex is intact.

CARTILAGE

Patellofemoral: Partial-thickness cartilage loss of the
patellofemoral compartment.

Medial: Partial-thickness cartilage loss of the medial femorotibial
compartment with areas of full-thickness cartilage loss of the
medial tibial plateau and subchondral reactive marrow edema in the
medial tibial plateau.

Lateral:  No focal chondral defect.

Joint:  No joint effusion. Normal Hoffa's fat. No plical thickening.

Popliteal Fossa:  No Baker cyst. Intact popliteus tendon.

Extensor Mechanism: Intact quadriceps tendon and patellar tendon.
Intact medial and lateral patellar retinaculum. Intact MPFL.

Bones: No other focal marrow signal abnormality. No fracture or
dislocation.

Other: No fluid collection or hematoma.
IMPRESSION: 1. Complex tear of the posterior horn and body of the medial
meniscus with a radial component.
2. Cartilage abnormalities of the patellofemoral compartment and
medial femorotibial compartment as described above.

## 2018-03-11 ENCOUNTER — Other Ambulatory Visit: Payer: Self-pay

## 2018-03-11 ENCOUNTER — Encounter (HOSPITAL_COMMUNITY)
Admission: RE | Disposition: A | Discharge status: Home / Self Care | Payer: Self-pay | Source: Ambulatory Visit | Attending: General Surgery

## 2018-03-11 ENCOUNTER — Ambulatory Visit (HOSPITAL_COMMUNITY)
Admission: RE | Admit: 2018-03-11 | Discharge: 2018-03-11 | Disposition: A | Discharge status: Home / Self Care | Payer: BLUE CROSS/BLUE SHIELD | Source: Ambulatory Visit | Attending: General Surgery | Admitting: General Surgery

## 2018-03-11 ENCOUNTER — Encounter (HOSPITAL_COMMUNITY): Payer: Self-pay | Admitting: *Deleted

## 2018-03-11 DIAGNOSIS — Z7984 Long term (current) use of oral hypoglycemic drugs: Secondary | ICD-10-CM | POA: Diagnosis not present

## 2018-03-11 DIAGNOSIS — Z87891 Personal history of nicotine dependence: Secondary | ICD-10-CM | POA: Insufficient documentation

## 2018-03-11 DIAGNOSIS — Z79899 Other long term (current) drug therapy: Secondary | ICD-10-CM | POA: Insufficient documentation

## 2018-03-11 DIAGNOSIS — Z1211 Encounter for screening for malignant neoplasm of colon: Secondary | ICD-10-CM | POA: Insufficient documentation

## 2018-03-11 HISTORY — DX: Type 2 diabetes mellitus without complications: E11.9

## 2018-03-11 HISTORY — DX: Unspecified osteoarthritis, unspecified site: M19.90

## 2018-03-11 HISTORY — PX: COLONOSCOPY: SHX5424

## 2018-03-11 LAB — GLUCOSE, CAPILLARY: GLUCOSE-CAPILLARY: 129 mg/dL — AB (ref 70–99)

## 2018-03-11 SURGERY — COLONOSCOPY
Anesthesia: Moderate Sedation

## 2018-03-11 MED ORDER — MEPERIDINE HCL 50 MG/ML IJ SOLN
INTRAMUSCULAR | Status: DC | PRN
  Administered 2018-03-11: 50 mg via INTRAVENOUS

## 2018-03-11 MED ORDER — STERILE WATER FOR IRRIGATION IR SOLN
Status: DC | PRN
  Administered 2018-03-11: 07:00:00

## 2018-03-11 MED ORDER — SODIUM CHLORIDE 0.9 % IV SOLN
INTRAVENOUS | Status: DC
  Administered 2018-03-11: 1000 mL via INTRAVENOUS

## 2018-03-11 MED ORDER — MEPERIDINE HCL 50 MG/ML IJ SOLN
INTRAMUSCULAR | Status: AC
  Filled 2018-03-11: qty 1

## 2018-03-11 MED ORDER — MIDAZOLAM HCL 5 MG/5ML IJ SOLN
INTRAMUSCULAR | Status: AC
  Filled 2018-03-11: qty 5

## 2018-03-11 MED ORDER — MIDAZOLAM HCL 5 MG/5ML IJ SOLN
INTRAMUSCULAR | Status: DC | PRN
  Administered 2018-03-11: 3 mg via INTRAVENOUS

## 2018-03-11 NOTE — Discharge Instructions (Signed)
Colonoscopy, Adult, Care After °This sheet gives you information about how to care for yourself after your procedure. Your health care provider may also give you more specific instructions. If you have problems or questions, contact your health care provider.  Dr Jenkins [phone] °What can I expect after the procedure? °After the procedure, it is common to have: °· A small amount of blood in your stool for 24 hours after the procedure. °· Some gas. °· Mild abdominal cramping or bloating. ° °Follow these instructions at home: °General instructions ° °· For the first 24 hours after the procedure: °? Do not drive or use machinery. °? Do not sign important documents. °? Do not drink alcohol. °? Do your regular daily activities at a slower pace than normal. °? Eat soft, easy-to-digest foods. °? Rest often. °· Take over-the-counter or prescription medicines only as told by your health care provider. °· It is up to you to get the results of your procedure. Ask your health care provider, or the department performing the procedure, when your results will be ready. °Relieving cramping and bloating °· Try walking around when you have cramps or feel bloated. °· Apply heat to your abdomen as told by your health care provider. Use a heat source that your health care provider recommends, such as a moist heat pack or a heating pad. °? Place a towel between your skin and the heat source. °? Leave the heat on for 20-30 minutes. °? Remove the heat if your skin turns bright red. This is especially important if you are unable to feel pain, heat, or cold. You may have a greater risk of getting burned. °Eating and drinking °· Drink enough fluid to keep your urine clear or pale yellow. °· Resume your normal diet as instructed by your health care provider. Avoid heavy or fried foods that are hard to digest. °· Avoid drinking alcohol for as long as instructed by your health care provider. °Contact a health care provider if: °· You have  blood in your stool 2-3 days after the procedure. °Get help right away if: °· You have more than a small spotting of blood in your stool. °· You pass large blood clots in your stool. °· Your abdomen is swollen. °· You have nausea or vomiting. °· You have a fever. °· You have increasing abdominal pain that is not relieved with medicine. °This information is not intended to replace advice given to you by your health care provider. Make sure you discuss any questions you have with your health care provider. °Document Released: 02/28/2004 Document Revised: 04/09/2016 Document Reviewed: 09/27/2015 °Elsevier Interactive Patient Education © 2018 Elsevier Inc. ° °

## 2018-03-11 NOTE — Interval H&P Note (Signed)
History and Physical Interval Note:  03/11/2018 7:21 AM  Joshua Hardy  has presented today for surgery, with the diagnosis of screening  The various methods of treatment have been discussed with the patient and family. After consideration of risks, benefits and other options for treatment, the patient has consented to  Procedure(s): COLONOSCOPY (N/A) as a surgical intervention .  The patient's history has been reviewed, patient examined, no change in status, stable for surgery.  I have reviewed the patient's chart and labs.  Questions were answered to the patient's satisfaction.     Franky MachoMark Yosselin Zoeller

## 2018-03-11 NOTE — Op Note (Signed)
[facility] Patient Name: Joshua Hardy Procedure Date: 03/11/2018 7:25 AM MRN: [id number] Date of Birth: [birth year] Attending MD: Franky Macho , MD CSN: [id number] Age: 62 Admit Type: Outpatient Procedure:                Colonoscopy Indications:              Screening for colorectal malignant neoplasm Providers:                Franky Macho, MD, Loma Messing B. Patsy Lager, RN, Burke Keels, Technician Referring MD:              Medicines:                Midazolam 3 mg IV, Meperidine 50 mg IV Complications:            No immediate complications. Estimated blood loss:                            None. Estimated Blood Loss:     Estimated blood loss: none. Procedure:                Pre-Anesthesia Assessment:                           - Prior to the procedure, a History and Physical                            was performed, and patient medications and                            allergies were reviewed. The patient is competent.                            The risks and benefits of the procedure and the                            sedation options and risks were discussed with the                            patient. All questions were answered and informed                            consent was obtained. Patient identification and                            proposed procedure were verified by the physician,                            the nurse and the technician in the procedure room.                            Mental Status Examination: alert and oriented.  Airway Examination: normal oropharyngeal airway and                            neck mobility. Respiratory Examination: clear to                            auscultation. CV Examination: RRR, no murmurs, no                            S3 or S4. Prophylactic Antibiotics: The patient                            does not require prophylactic antibiotics. Prior   Anticoagulants: The patient has taken no previous                            anticoagulant or antiplatelet agents. ASA Grade                            Assessment: II - A patient with mild systemic                            disease. After reviewing the risks and benefits,                            the patient was deemed in satisfactory condition to                            undergo the procedure. The anesthesia plan was to                            use moderate sedation / analgesia (conscious                            sedation). Immediately prior to administration of                            medications, the patient was re-assessed for                            adequacy to receive sedatives. The heart rate,                            respiratory rate, oxygen saturations, blood                            pressure, adequacy of pulmonary ventilation, and                            response to care were monitored throughout the                            procedure. The physical status of the patient was  re-assessed after the procedure.                           After obtaining informed consent, the colonoscope                            was passed under direct vision. Throughout the                            procedure, the patient's blood pressure, pulse, and                            oxygen saturations were monitored continuously. The                            [id number]-[id number] ([id number] scope was introduced through                            the anus and advanced to the the cecum, identified                            by the appendiceal orifice, ileocecal valve and                            palpation. No anatomical landmarks were                            photographed. The entire colon was well visualized.                            The colonoscopy was performed without difficulty.                            The patient tolerated the procedure well. The                             quality of the bowel preparation was adequate. The                            total duration of the procedure was 10 minutes. Scope In: 7:28:31 AM Scope Out: 7:36:54 AM Scope Withdrawal Time: 0 hours 3 minutes 48 seconds  Total Procedure Duration: 0 hours 8 minutes 23 seconds  Findings:      The perianal and digital rectal examinations were normal.      The entire examined colon appeared normal on direct and retroflexion       views. Impression:               - The entire examined colon is normal on direct and                            retroflexion views.                           - No specimens collected. Moderate Sedation:      Moderate (  conscious) sedation was administered by the endoscopy nurse       and supervised by the endoscopist. The following parameters were       monitored: oxygen saturation, heart rate, blood pressure, and response       to care. Recommendation:           - Patient has a contact number available for                            emergencies. The signs and symptoms of potential                            delayed complications were discussed with the                            patient. Return to normal activities tomorrow.                            Written discharge instructions were provided to the                            patient.                           - Written discharge instructions were provided to                            the patient.                           - The signs and symptoms of potential delayed                            complications were discussed with the patient.                           - Patient has a contact number available for                            emergencies.                           - Return to normal activities tomorrow.                           - Resume previous diet.                           - Continue present medications.                           - Repeat colonoscopy in 10 years for  screening                            purposes. Procedure Code(s):        --- Professional ---  40981, Colonoscopy, flexible; diagnostic, including                            collection of specimen(s) by brushing or washing,                            when performed (separate procedure) Diagnosis Code(s):        --- Professional ---                           Z12.11, Encounter for screening for malignant                            neoplasm of colon CPT copyright 2017 [employer]. All rights reserved. The codes documented in this report are preliminary and upon coder review may  be revised to meet current compliance requirements. Franky Macho, MD Franky Macho, MD 03/11/2018 7:41:53 AM This report has been signed electronically. Number of Addenda: 0

## 2018-03-14 ENCOUNTER — Encounter (HOSPITAL_COMMUNITY): Payer: Self-pay | Admitting: General Surgery

## 2020-12-30 ENCOUNTER — Other Ambulatory Visit: Payer: Self-pay

## 2020-12-30 ENCOUNTER — Encounter (HOSPITAL_COMMUNITY): Payer: Self-pay | Admitting: *Deleted

## 2020-12-30 ENCOUNTER — Emergency Department (HOSPITAL_COMMUNITY): Payer: 59

## 2020-12-30 ENCOUNTER — Emergency Department (HOSPITAL_COMMUNITY)
Admission: EM | Admit: 2020-12-30 | Discharge: 2020-12-31 | Disposition: A | Discharge status: Home / Self Care | Payer: 59 | Attending: Emergency Medicine | Admitting: Emergency Medicine

## 2020-12-30 DIAGNOSIS — E119 Type 2 diabetes mellitus without complications: Secondary | ICD-10-CM | POA: Diagnosis not present

## 2020-12-30 DIAGNOSIS — R519 Headache, unspecified: Secondary | ICD-10-CM | POA: Diagnosis present

## 2020-12-30 DIAGNOSIS — I10 Essential (primary) hypertension: Secondary | ICD-10-CM | POA: Insufficient documentation

## 2020-12-30 DIAGNOSIS — Z87891 Personal history of nicotine dependence: Secondary | ICD-10-CM | POA: Insufficient documentation

## 2020-12-30 DIAGNOSIS — Z7984 Long term (current) use of oral hypoglycemic drugs: Secondary | ICD-10-CM | POA: Insufficient documentation

## 2020-12-30 MED ORDER — DIPHENHYDRAMINE HCL 25 MG PO CAPS
50.0000 mg | ORAL_CAPSULE | Freq: Once | ORAL | Status: AC
  Administered 2020-12-30: 50 mg via ORAL
  Filled 2020-12-30: qty 2

## 2020-12-30 MED ORDER — METOCLOPRAMIDE HCL 5 MG/ML IJ SOLN
10.0000 mg | Freq: Once | INTRAMUSCULAR | Status: AC
  Administered 2020-12-30: 10 mg via INTRAMUSCULAR
  Filled 2020-12-30: qty 2

## 2020-12-30 MED ORDER — DEXAMETHASONE 4 MG PO TABS
10.0000 mg | ORAL_TABLET | Freq: Once | ORAL | Status: AC
  Administered 2020-12-30: 10 mg via ORAL
  Filled 2020-12-30: qty 3

## 2020-12-30 MED ORDER — KETOROLAC TROMETHAMINE 60 MG/2ML IM SOLN
60.0000 mg | Freq: Once | INTRAMUSCULAR | Status: AC
  Administered 2020-12-30: 60 mg via INTRAMUSCULAR
  Filled 2020-12-30: qty 2

## 2020-12-30 NOTE — ED Triage Notes (Signed)
Headache for over a week

## 2020-12-30 NOTE — ED Provider Notes (Signed)
[facility] EMERGENCY DEPARTMENT Provider Note   CSN: [id number] Arrival date & time: 12/30/20  1757     History Chief Complaint  Patient presents with  . Headache    Joshua Hardy is a 65 y.o. male.  Patient is here with a headache.  Has a history of some headaches but this was been going on and off every day for the last week.  States he has had his head a couple times but nothing that would have caused this headache.  Patient states that he does not have any photophobia or phonophobia.  Patient states he does have some stress at work as of any drinking normally.  No fevers, stiff neck or other recent illnesses.  He tries medications at home that help intermittently.  Has some nausea but no emesis.        Past Medical History:  Diagnosis Date  . Arthritis    bilateral knees  . Diabetes mellitus without complication (HCC)   . Lateral meniscus tear   . Medial meniscus tear   . Sleep apnea    did use cpap-had corrective surgery 02-no more apnea  . Wears partial dentures    top partial    Patient Active Problem List   Diagnosis Date Noted  . Special screening for malignant neoplasms, colon   . Sleep apnea   . Wears partial dentures   . Medial meniscus tear   . Lateral meniscus tear     Past Surgical History:  Procedure Laterality Date  . COLONOSCOPY N/A 03/11/2018   Procedure: COLONOSCOPY;  Surgeon: Franky Macho, MD;  [facility];  Service: Gastroenterology;  Laterality: N/A;  . KNEE ARTHROSCOPY WITH MEDIAL MENISECTOMY Left 08/05/2013   Procedure: LEFT KNEE ARTHROSCOPY WITH MEDIAL AND LATERAL MENISECTOMY;  Surgeon: Nilda Simmer, MD;  Location: [facility];  Service: Orthopedics;  Laterality: Left;  . NASAL SEPTOPLASTY W/ TURBINOPLASTY  2002  . TONSILLECTOMY         No family history on file.  Social History   Tobacco Use  . Smoking status: Former Smoker    Quit date: 08/04/2001    Years since quitting: 19.4  . Smokeless tobacco:  Never Used  Vaping Use  . Vaping Use: Never used  Substance Use Topics  . Alcohol use: Yes    Comment: occ  . Drug use: No    Home Medications Prior to Admission medications   Medication Sig Start Date End Date Taking? Authorizing Provider  acetaminophen (TYLENOL) 650 MG CR tablet Take 650 mg by mouth every 8 (eight) hours as needed for pain.    [provider]  atorvastatin (LIPITOR) 20 MG tablet Take 20 mg by mouth daily.    [provider]  Glucosamine 500 MG CAPS Take 500 mg by mouth daily as needed (joint pain).    [provider]  metFORMIN (GLUCOPHAGE) 500 MG tablet Take 500 mg by mouth 2 (two) times daily with a meal.    [provider]    Allergies    Patient has no known allergies.  Review of Systems   Review of Systems  All other systems reviewed and are negative.   Physical Exam Updated Vital Signs BP (!) 180/90 (BP Location: Right Arm)   Pulse 66   Temp 99.1 F (37.3 C) (Oral)   Resp 20   SpO2 100%   Physical Exam Vitals and nursing note reviewed.  Constitutional:      Appearance: He is well-developed.  HENT:     Head: Normocephalic and atraumatic.     Mouth/Throat:     Mouth: Mucous membranes are moist.     Pharynx: Oropharynx is clear.  Eyes:     Conjunctiva/sclera: Conjunctivae normal.     Pupils: Pupils are equal, round, and reactive to light.  Cardiovascular:     Rate and Rhythm: Normal rate.  Pulmonary:     Effort: Pulmonary effort is normal. No respiratory distress.  Abdominal:     General: There is no distension.  Musculoskeletal:        General: Normal range of motion.     Cervical back: Normal range of motion.  Skin:    General: Skin is warm.  Neurological:     General: No focal deficit present.     Mental Status: He is alert.     Comments: No altered mental status, able to give full seemingly accurate history.  Face is symmetric, EOM's intact, pupils equal and reactive, vision intact, tongue and  uvula midline without deviation. Upper and Lower extremity motor 5/5, intact pain perception in distal extremities, 2+ reflexes in biceps, patella and achilles tendons.  Walks without assistance or evident ataxia.      ED Results / Procedures / Treatments   Labs (all labs ordered are listed, but only abnormal results are displayed) Labs Reviewed - No data to display  EKG None  Radiology CT Head Wo Contrast  Result Date: 12/30/2020 CLINICAL DATA:  Headache with nausea and photophobia EXAM: CT HEAD WITHOUT CONTRAST TECHNIQUE: Contiguous axial images were obtained from the base of the skull through the vertex without intravenous contrast. COMPARISON:  None. FINDINGS: Brain: Ventricles and sulci are normal in size and configuration. There is no intracranial mass, hemorrhage, extra-axial fluid collection, or midline shift. Brain parenchyma appears unremarkable. No appreciable acute infarct. Vascular: No hyperdense vessel. There are foci of calcification in each carotid siphon region. Skull: The bony calvarium appears intact. Sinuses/Orbits: There is mucosal thickening in several ethmoid air cells. Other visualized paranasal sinuses are clear. Orbits appear symmetric bilaterally. Other: There is opacification in several inferior mastoid air cells on the left. Visualized mastoids elsewhere appear clear. IMPRESSION: Brain parenchyma appears unremarkable. No mass or hemorrhage. No infarct. There are foci of arterial vascular calcification. There is mucosal thickening in several ethmoid air cells. There is opacification in several inferior mastoid air cells on the left. Electronically Signed   By: Bretta Bang III M.D.   On: 12/30/2020 19:52    Procedures Procedures   Medications Ordered in ED Medications  diphenhydrAMINE (BENADRYL) capsule 50 mg (has no administration in time range)  dexamethasone (DECADRON) tablet 10 mg (has no administration in time range)  metoCLOPramide (REGLAN) injection 10  mg (has no administration in time range)  ketorolac (TORADOL) injection 60 mg (has no administration in time range)    ED Course  I have reviewed the triage vital signs and the nursing notes.  Pertinent labs & imaging results that were available during my care of the patient were reviewed by me and considered in my medical decision making (see chart for details).    MDM Rules/Calculators/A&P                          Headache without red flags however not entirely consistent with any singular type of headache.  We will give a headache cocktail and discharge.  Have him follow-up with his primary doctor and neurology as needed.  Final Clinical Impression(s) / ED Diagnoses Final diagnoses:  Nonintractable headache, unspecified chronicity pattern, unspecified headache type  Hypertension, unspecified type    Rx / DC Orders ED Discharge Orders    None       Dodd Schmid, Barbara Cower, MD 12/31/20 [phone]

## 2020-12-30 NOTE — ED Triage Notes (Signed)
States he had a headache and then he hit his head twice on cars this week. Works as a Curator

## 2020-12-30 NOTE — ED Provider Notes (Signed)
Emergency Medicine Provider Triage Evaluation Note  Joshua Hardy , a 65 y.o. male  was evaluated in triage.  Pt complains of persistent headache, photophobia and worsening nausea without emesis x1 week.  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  Excedrin taken without improvement.  Review of Systems  Positive: Headache, nausea Negative: Fever, confusion  Physical Exam  BP (!) 180/90 (BP Location: Right Arm)   Pulse 66   Temp 99.1 F (37.3 C) (Oral)   Resp 20   SpO2 100%  Gen:   Awake, no distress   Resp:  Normal effort  MSK:   Moves extremities without difficulty  Other:    Medical Decision Making  Medically screening exam initiated at 7:26 PM.  Appropriate orders placed.  Chantry A Fahs was informed that the remainder of the evaluation will be completed by another provider, this initial triage assessment does not replace that evaluation, and the importance of remaining in the ED until their evaluation is complete.  Head CT ordered, exam nonfocal.  Stable.   Burgess Amor, PA-C 12/30/20 1929    Mancel Bale, MD 12/31/20 1041

## 2023-04-09 DIAGNOSIS — I872 Venous insufficiency (chronic) (peripheral): Secondary | ICD-10-CM | POA: Insufficient documentation

## 2023-04-09 DIAGNOSIS — I890 Lymphedema, not elsewhere classified: Secondary | ICD-10-CM | POA: Insufficient documentation

## 2023-04-09 DIAGNOSIS — E119 Type 2 diabetes mellitus without complications: Secondary | ICD-10-CM | POA: Insufficient documentation

## 2023-04-09 DIAGNOSIS — E785 Hyperlipidemia, unspecified: Secondary | ICD-10-CM | POA: Insufficient documentation

## 2023-04-09 NOTE — Progress Notes (Signed)
MRN : [id number]  Joshua Hardy is a 67 y.o. (1956-03-06) male who presents with chief complaint of legs hurt and swell.  History of Present Illness:   Patient is seen for evaluation of leg pain and leg swelling. The patient first noticed the swelling remotely. The swelling is associated with pain and discoloration. The pain and swelling worsens with prolonged dependency and improves with elevation. The pain is unrelated to activity.  The patient notes that in the morning the legs are significantly improved but they steadily worsened throughout the course of the day. The patient also notes a steady worsening of the discoloration in the ankle and shin area.  He notes that he has had several episodes of blistering and weeping.  The patient denies claudication symptoms.  The patient denies symptoms consistent with rest pain.  The patient denies and extensive history of DJD and LS spine disease.  The patient has no had any past angiography, interventions or vascular surgery.  Elevation makes the leg symptoms better, dependency makes them much worse. There is no history of ulcerations. The patient denies any recent changes in medications.  The patient has not been wearing graduated compression.  The patient denies a history of DVT or PE. There is no prior history of phlebitis. There is no history of primary lymphedema.  No history of malignancies. No history of trauma or groin or pelvic surgery. There is no history of radiation treatment to the groin or pelvis  The patient denies amaurosis fugax or recent TIA symptoms. There are no recent neurological changes noted. The patient denies recent episodes of angina or shortness of breath  No outpatient medications have been marked as taking for the 04/11/23 encounter (Appointment) with Gilda Crease, Latina Craver, MD.    Past Medical History:  Diagnosis Date   Arthritis    bilateral knees   Diabetes mellitus without complication (HCC)     Lateral meniscus tear    Medial meniscus tear    Sleep apnea    did use cpap-had corrective surgery 02-no more apnea   Wears partial dentures    top partial    Past Surgical History:  Procedure Laterality Date   COLONOSCOPY N/A 03/11/2018   Procedure: COLONOSCOPY;  Surgeon: Franky Macho, MD;  [facility]: [facility];  Service: Gastroenterology;  Laterality: N/A;   KNEE ARTHROSCOPY WITH MEDIAL MENISECTOMY Left 08/05/2013   Procedure: LEFT KNEE ARTHROSCOPY WITH MEDIAL AND LATERAL MENISECTOMY;  Surgeon: Nilda Simmer, MD;  Location: [facility];  Service: Orthopedics;  Laterality: Left;   NASAL SEPTOPLASTY W/ TURBINOPLASTY  2002   TONSILLECTOMY      Social History Social History   Tobacco Use   Smoking status: Former    Current packs/day: 0.00    Types: Cigarettes    Quit date: 08/04/2001    Years since quitting: 21.6   Smokeless tobacco: Never  Vaping Use   Vaping status: Never Used  Substance Use Topics   Alcohol use: Yes    Comment: occ   Drug use: No    Family History No family history on file.  No Known Allergies   REVIEW OF SYSTEMS (Negative unless checked)  Constitutional: [] Weight loss  [] Fever  [] Chills Cardiac: [] Chest pain   [] Chest pressure   [] Palpitations   [] Shortness of breath when laying flat   [] Shortness of breath with exertion. Vascular:  [] Pain in legs with walking   [x] Pain in legs at rest  [] History of  DVT   [] Phlebitis   [x] Swelling in legs   [] Varicose veins   [] Non-healing ulcers Pulmonary:   [] Uses home oxygen   [] Productive cough   [] Hemoptysis   [] Wheeze  [] COPD   [] Asthma Neurologic:  [] Dizziness   [] Seizures   [] History of stroke   [] History of TIA  [] Aphasia   [] Vissual changes   [] Weakness or numbness in arm   [] Weakness or numbness in leg Musculoskeletal:   [] Joint swelling   [] Joint pain   [] Low back pain Hematologic:  [] Easy bruising  [] Easy bleeding   [] Hypercoagulable state   [] Anemic Gastrointestinal:  [] Diarrhea    [] Vomiting  [] Gastroesophageal reflux/heartburn   [] Difficulty swallowing. Genitourinary:  [] Chronic kidney disease   [] Difficult urination  [] Frequent urination   [] Blood in urine Skin:  [] Rashes   [] Ulcers  Psychological:  [] History of anxiety   []  History of major depression.  Physical Examination  There were no vitals filed for this visit. There is no height or weight on file to calculate BMI. Gen: WD/WN, NAD Head: [place]/AT, No temporalis wasting.  Ear/Nose/Throat: Hearing grossly intact, nares w/o erythema or drainage, pinna without lesions Eyes: PER, EOMI, sclera nonicteric.  Neck: Supple, no gross masses.  No JVD.  Pulmonary:  Good air movement, no audible wheezing, no use of accessory muscles.  Cardiac: RRR, precordium not hyperdynamic. Vascular:  scattered varicosities present bilaterally.  Severe venous stasis changes to the legs bilaterally.  4+ soft pitting edema. CEAP C4sEpAsPr   Vessel Right Left  Radial Palpable Palpable  Gastrointestinal: soft, non-distended. No guarding/no peritoneal signs.  Musculoskeletal: M/S 5/5 throughout.  No deformity.  Neurologic: CN 2-12 intact. Pain and light touch intact in extremities.  Symmetrical.  Speech is fluent. Motor exam as listed above. Psychiatric: Judgment intact, Mood & affect appropriate for pt's clinical situation. Dermatologic: Venous rashes no ulcers noted.  No changes consistent with cellulitis. Lymph : No lichenification or skin changes of chronic lymphedema.  CBC Lab Results  Component Value Date   HGB 13.9 08/05/2013    BMET No results found for: "NA", "K", "CL", "CO2", "GLUCOSE", "BUN", "CREATININE", "CALCIUM", "GFRNONAA", "GFRAA" CrCl cannot be calculated (No successful lab value found.).  COAG No results found for: "INR", "PROTIME"  Radiology No results found.   Assessment/Plan 1. Chronic venous insufficiency Recommend:  I have had a long discussion with the patient regarding swelling and why it  causes  symptoms.  Patient will begin wearing graduated compression on a daily basis a prescription was given. The patient will  wear the stockings first thing in the morning and removing them in the evening. The patient is instructed specifically not to sleep in the stockings.   In addition, behavioral modification will be initiated.  This will include frequent elevation, use of over the counter pain medications and exercise such as walking.  Consideration for a lymph pump will also be made based upon the effectiveness of conservative therapy.  This would help to improve the edema control and prevent sequela such as ulcers and infections   Patient should undergo duplex ultrasound of the venous system to ensure that DVT or reflux is not present.  The patient will follow-up with me after the ultrasound.  - VAS [place] LOWER EXTREMITY VENOUS (DVT); Future  2. Lymphedema Recommend:  I have had a long discussion with the patient regarding swelling and why it  causes symptoms.  Patient will begin wearing graduated compression on a daily basis a prescription was given. The patient will  wear the  stockings first thing in the morning and removing them in the evening. The patient is instructed specifically not to sleep in the stockings.   In addition, behavioral modification will be initiated.  This will include frequent elevation, use of over the counter pain medications and exercise such as walking.  Consideration for a lymph pump will also be made based upon the effectiveness of conservative therapy.  This would help to improve the edema control and prevent sequela such as ulcers and infections   Patient should undergo duplex ultrasound of the venous system to ensure that DVT or reflux is not present.  The patient will follow-up with me after the ultrasound.  - VAS [place] LOWER EXTREMITY VENOUS (DVT); Future  3. Mixed hyperlipidemia Continue statin as ordered and reviewed, no changes at this time  4. Type 2  diabetes mellitus without complication, without long-term current use of insulin (HCC) Continue hypoglycemic medications as already ordered, these medications have been reviewed and there are no changes at this time.  Hgb A1C to be monitored as already arranged by primary service    Levora Dredge, MD  04/09/2023 8:45 AM

## 2023-04-11 ENCOUNTER — Ambulatory Visit (INDEPENDENT_AMBULATORY_CARE_PROVIDER_SITE_OTHER): Payer: Medicare Other | Admitting: Vascular Surgery

## 2023-04-11 ENCOUNTER — Encounter (INDEPENDENT_AMBULATORY_CARE_PROVIDER_SITE_OTHER): Payer: Self-pay | Admitting: Vascular Surgery

## 2023-04-11 VITALS — BP 126/67 | HR 67 | Resp 16 | Ht 66.0 in | Wt 310.0 lb

## 2023-04-11 DIAGNOSIS — E119 Type 2 diabetes mellitus without complications: Secondary | ICD-10-CM | POA: Diagnosis not present

## 2023-04-11 DIAGNOSIS — E782 Mixed hyperlipidemia: Secondary | ICD-10-CM | POA: Diagnosis not present

## 2023-04-11 DIAGNOSIS — I872 Venous insufficiency (chronic) (peripheral): Secondary | ICD-10-CM | POA: Diagnosis not present

## 2023-04-11 DIAGNOSIS — I890 Lymphedema, not elsewhere classified: Secondary | ICD-10-CM

## 2023-04-13 ENCOUNTER — Encounter (INDEPENDENT_AMBULATORY_CARE_PROVIDER_SITE_OTHER): Payer: Self-pay | Admitting: Vascular Surgery

## 2023-07-11 ENCOUNTER — Ambulatory Visit (INDEPENDENT_AMBULATORY_CARE_PROVIDER_SITE_OTHER): Payer: Medicare Other | Admitting: Nurse Practitioner

## 2023-07-11 ENCOUNTER — Encounter (INDEPENDENT_AMBULATORY_CARE_PROVIDER_SITE_OTHER): Payer: Self-pay | Admitting: Nurse Practitioner

## 2023-07-11 ENCOUNTER — Ambulatory Visit (INDEPENDENT_AMBULATORY_CARE_PROVIDER_SITE_OTHER): Payer: Medicare Other

## 2023-07-11 VITALS — BP 128/74 | HR 66 | Resp 17 | Wt 311.0 lb

## 2023-07-11 DIAGNOSIS — I83811 Varicose veins of right lower extremities with pain: Secondary | ICD-10-CM

## 2023-07-11 DIAGNOSIS — E782 Mixed hyperlipidemia: Secondary | ICD-10-CM | POA: Diagnosis not present

## 2023-07-11 DIAGNOSIS — I872 Venous insufficiency (chronic) (peripheral): Secondary | ICD-10-CM | POA: Diagnosis not present

## 2023-07-11 DIAGNOSIS — I890 Lymphedema, not elsewhere classified: Secondary | ICD-10-CM | POA: Diagnosis not present

## 2023-07-11 DIAGNOSIS — E119 Type 2 diabetes mellitus without complications: Secondary | ICD-10-CM | POA: Diagnosis not present

## 2023-07-14 ENCOUNTER — Encounter (INDEPENDENT_AMBULATORY_CARE_PROVIDER_SITE_OTHER): Payer: Self-pay | Admitting: Nurse Practitioner

## 2023-07-14 NOTE — Progress Notes (Signed)
Subjective:    Patient ID: Joshua Hardy, male    DOB: [birth year], 67 y.o.   MRN: [id number] Chief Complaint  Patient presents with   Follow-up    3 month bil reflux follow up    The patient returns for followup evaluation 3 months after the initial visit. The patient continues to have pain in the lower extremities with dependency. The pain is lessened with elevation. Graduated compression stockings, Class I (20-30 mmHg), have been worn but the stockings do not eliminate the leg pain. Over-the-counter analgesics do not improve the symptoms. The degree of discomfort continues to interfere with daily activities. The patient notes the pain in the legs is causing problems with daily exercise, at the workplace and even with household activities and maintenance such as standing in the kitchen preparing meals and doing dishes.   Venous ultrasound shows normal deep venous system, no evidence of acute or chronic DVT.  Superficial reflux is present in the right great saphenous vein and small saphenous vein as well as an anterior accessory knee also has reflux in the greater saphenous vein of the left.  He has deep venous insufficiency bilaterally  great saphenous vein.      Review of Systems  Cardiovascular:  Positive for leg swelling.  Skin:  Negative for wound.  All other systems reviewed and are negative.      Objective:   Physical Exam Vitals reviewed.  HENT:     Head: Normocephalic.  Cardiovascular:     Rate and Rhythm: Normal rate.     Pulses: Normal pulses.  Pulmonary:     Effort: Pulmonary effort is normal.  Musculoskeletal:     Right lower leg: Edema present.     Left lower leg: Edema present.  Skin:    General: Skin is warm and dry.  Neurological:     Mental Status: He is alert and oriented to person, place, and time.  Psychiatric:        Mood and Affect: Mood normal.        Behavior: Behavior normal.        Thought Content: Thought content normal.        Judgment:  Judgment normal.     BP 128/74   Pulse 66   Resp 17   Wt (!) 311 lb (141.1 kg)   BMI 50.20 kg/m   Past Medical History:  Diagnosis Date   Arthritis    bilateral knees   Diabetes mellitus without complication (HCC)    Lateral meniscus tear    Medial meniscus tear    Sleep apnea    did use cpap-had corrective surgery 02-no more apnea   Wears partial dentures    top partial    Social History   Socioeconomic History   Marital status: Married    Spouse name: Not on file   Number of children: Not on file   Years of education: Not on file   Highest education level: Not on file  Occupational History   Not on file  Tobacco Use   Smoking status: Former    Current packs/day: 0.00    Types: Cigarettes    Quit date: 08/04/2001    Years since quitting: 21.9   Smokeless tobacco: Never  Vaping Use   Vaping status: Never Used  Substance and Sexual Activity   Alcohol use: Yes    Comment: occ   Drug use: No   Sexual activity: Not on file  Other Topics Concern   Not  on file  Social History Narrative   Not on file   Social Drivers of Health   Financial Resource Strain: Not on file  Food Insecurity: Not on file  Transportation Needs: Not on file  Physical Activity: Not on file  Stress: Not on file  Social Connections: Not on file  Intimate Partner Violence: Not on file    Past Surgical History:  Procedure Laterality Date   COLONOSCOPY N/A 03/11/2018   Procedure: COLONOSCOPY;  Surgeon: Franky Macho, MD;  [facility];  Service: Gastroenterology;  Laterality: N/A;   KNEE ARTHROSCOPY WITH MEDIAL MENISECTOMY Left 08/05/2013   Procedure: LEFT KNEE ARTHROSCOPY WITH MEDIAL AND LATERAL MENISECTOMY;  Surgeon: Nilda Simmer, MD;  [facility];  Service: Orthopedics;  Laterality: Left;   NASAL SEPTOPLASTY W/ TURBINOPLASTY  2002   TONSILLECTOMY      Family History  Problem Relation Age of Onset   Hypertension Mother    Diabetes Mother     Diabetes Father    Glaucoma Father     No Known Allergies     Latest Ref Rng & Units 08/05/2013   10:30 AM  CBC  Hemoglobin 13.0 - 17.0 g/dL 16.1       CMP  No results found for: "NA", "K", "CL", "CO2", "GLUCOSE", "BUN", "CREATININE", "CALCIUM", "PROT", "ALBUMIN", "AST", "ALT", "ALKPHOS", "BILITOT", "GFR", "EGFR", "GFRNONAA"   No results found.     Assessment & Plan:   1. Varicose veins of right lower extremity with pain (Primary) Recommend  I have reviewed my previous  discussion with the patient regarding  varicose veins and why they cause symptoms. Patient will continue  wearing graduated compression stockings class 1 on a daily basis, beginning first thing in the morning and removing them in the evening.  The patient is CEAP C3sEpAsPr.  The patient has been wearing compression for more than 12 weeks with no or little benefit.  The patient has been exercising daily for more than 12 weeks. The patient has been elevating and taking OTC pain medications for more than 12 weeks.  None of these have have eliminated the pain related to the varicose veins and venous reflux or the discomfort regarding venous congestion.    In addition, behavioral modification including elevation during the day was again discussed and this will continue.  The patient has utilized over the counter pain medications and has been exercising.  However, at this time conservative therapy has not alleviated the patient's symptoms of leg pain and swelling  Recommend: laser ablation of the right great and small saphenous veins to eliminate the symptoms of pain and swelling of the lower extremities caused by the severe superficial venous reflux disease.   2. Mixed hyperlipidemia Continue statin as ordered and reviewed, no changes at this time  3. Type 2 diabetes mellitus without complication, without long-term current use of insulin (HCC) Continue hypoglycemic medications as already ordered, these medications  have been reviewed and there are no changes at this time.  Hgb A1C to be monitored as already arranged by primary service   Current Outpatient Medications on File Prior to Visit  Medication Sig Dispense Refill   acetaminophen (TYLENOL) 650 MG CR tablet Take 650 mg by mouth every 8 (eight) hours as needed for pain.     atorvastatin (LIPITOR) 20 MG tablet Take 20 mg by mouth daily.     furosemide (LASIX) 40 MG tablet Take 40 mg by mouth daily.     Glucosamine 500 MG  CAPS Take 500 mg by mouth daily as needed (joint pain).     metFORMIN (GLUCOPHAGE) 500 MG tablet Take 500 mg by mouth 2 (two) times daily with a meal.     OZEMPIC, 0.25 OR 0.5 MG/DOSE, 2 MG/3ML SOPN      No current facility-administered medications on file prior to visit.    There are no Patient Instructions on file for this visit. No follow-ups on file.   Georgiana Spinner, NP

## 2023-09-25 ENCOUNTER — Other Ambulatory Visit (INDEPENDENT_AMBULATORY_CARE_PROVIDER_SITE_OTHER): Payer: Self-pay

## 2023-09-25 ENCOUNTER — Telehealth (INDEPENDENT_AMBULATORY_CARE_PROVIDER_SITE_OTHER): Payer: Self-pay | Admitting: Vascular Surgery

## 2023-09-25 NOTE — Telephone Encounter (Signed)
 sent

## 2023-09-25 NOTE — Telephone Encounter (Signed)
 Patient will be having a right leg laser on 3.27.25 with Dr. Gilda Crease. He will need a standard protocol RX for laser ablation procedure. Pharmacy is [facility] on [address].

## 2023-09-26 MED ORDER — ALPRAZOLAM 0.5 MG PO TABS: ORAL_TABLET | ORAL | 0 refills | Status: AC

## 2023-10-24 ENCOUNTER — Other Ambulatory Visit (INDEPENDENT_AMBULATORY_CARE_PROVIDER_SITE_OTHER): Payer: Medicare Other | Admitting: Vascular Surgery

## 2023-10-31 ENCOUNTER — Encounter (INDEPENDENT_AMBULATORY_CARE_PROVIDER_SITE_OTHER): Payer: Medicare Other

## 2023-11-21 ENCOUNTER — Ambulatory Visit (INDEPENDENT_AMBULATORY_CARE_PROVIDER_SITE_OTHER): Payer: Medicare Other | Admitting: Vascular Surgery

## 2024-01-26 ENCOUNTER — Other Ambulatory Visit: Payer: Self-pay

## 2024-01-26 ENCOUNTER — Emergency Department (HOSPITAL_COMMUNITY)

## 2024-01-26 ENCOUNTER — Emergency Department (HOSPITAL_COMMUNITY)
Admission: EM | Admit: 2024-01-26 | Discharge: 2024-01-26 | Disposition: A | Discharge status: Home / Self Care | Attending: Emergency Medicine | Admitting: Emergency Medicine

## 2024-01-26 DIAGNOSIS — R1031 Right lower quadrant pain: Secondary | ICD-10-CM | POA: Diagnosis present

## 2024-01-26 DIAGNOSIS — N132 Hydronephrosis with renal and ureteral calculous obstruction: Secondary | ICD-10-CM | POA: Diagnosis not present

## 2024-01-26 DIAGNOSIS — Z7984 Long term (current) use of oral hypoglycemic drugs: Secondary | ICD-10-CM | POA: Insufficient documentation

## 2024-01-26 DIAGNOSIS — I7143 Infrarenal abdominal aortic aneurysm, without rupture: Secondary | ICD-10-CM | POA: Insufficient documentation

## 2024-01-26 DIAGNOSIS — R748 Abnormal levels of other serum enzymes: Secondary | ICD-10-CM | POA: Insufficient documentation

## 2024-01-26 LAB — CBC
HCT: 38.4 % — ABNORMAL LOW (ref 39.0–52.0)
Hemoglobin: 13 g/dL (ref 13.0–17.0)
MCH: 33.3 pg (ref 26.0–34.0)
MCHC: 33.9 g/dL (ref 30.0–36.0)
MCV: 98.5 fL (ref 80.0–100.0)
Platelets: 203 10*3/uL (ref 150–400)
RBC: 3.9 MIL/uL — ABNORMAL LOW (ref 4.22–5.81)
RDW: 12.3 % (ref 11.5–15.5)
WBC: 10.4 10*3/uL (ref 4.0–10.5)
nRBC: 0 % (ref 0.0–0.2)

## 2024-01-26 LAB — COMPREHENSIVE METABOLIC PANEL WITH GFR
ALT: 23 U/L (ref 0–44)
AST: 22 U/L (ref 15–41)
Albumin: 4 g/dL (ref 3.5–5.0)
Alkaline Phosphatase: 59 U/L (ref 38–126)
Anion gap: 9 (ref 5–15)
BUN: 27 mg/dL — ABNORMAL HIGH (ref 8–23)
CO2: 25 mmol/L (ref 22–32)
Calcium: 9.2 mg/dL (ref 8.9–10.3)
Chloride: 104 mmol/L (ref 98–111)
Creatinine, Ser: 1.55 mg/dL — ABNORMAL HIGH (ref 0.61–1.24)
GFR, Estimated: 49 mL/min — ABNORMAL LOW (ref >60)
Glucose, Bld: 135 mg/dL — ABNORMAL HIGH (ref 70–99)
Potassium: 5 mmol/L (ref 3.5–5.1)
Sodium: 138 mmol/L (ref 135–145)
Total Bilirubin: 0.6 mg/dL (ref 0.0–1.2)
Total Protein: 7.5 g/dL (ref 6.5–8.1)

## 2024-01-26 LAB — LIPASE, BLOOD: Lipase: 119 U/L — ABNORMAL HIGH (ref 11–51)

## 2024-01-26 LAB — URINALYSIS, ROUTINE W REFLEX MICROSCOPIC
Bacteria, UA: NONE SEEN
Bilirubin Urine: NEGATIVE
Glucose, UA: NEGATIVE mg/dL
Ketones, ur: NEGATIVE mg/dL
Leukocytes,Ua: NEGATIVE
Nitrite: NEGATIVE
Protein, ur: 30 mg/dL — AB
RBC / HPF: >50 RBC/hpf (ref 0–5)
Specific Gravity, Urine: 1.019 (ref 1.005–1.030)
pH: 5 (ref 5.0–8.0)

## 2024-01-26 MED ORDER — OXYCODONE-ACETAMINOPHEN 5-325 MG PO TABS: 1.0000 | ORAL_TABLET | Freq: Four times a day (QID) | ORAL | 0 refills | Status: AC | PRN

## 2024-01-26 MED ORDER — TAMSULOSIN HCL 0.4 MG PO CAPS: 0.4000 mg | ORAL_CAPSULE | Freq: Every day | ORAL | 0 refills | Status: AC

## 2024-01-26 MED ORDER — SODIUM CHLORIDE 0.9 % IV BOLUS
1000.0000 mL | Freq: Once | INTRAVENOUS | Status: AC
  Administered 2024-01-26: 1000 mL via INTRAVENOUS

## 2024-01-26 MED ORDER — MORPHINE SULFATE (PF) 4 MG/ML IV SOLN
4.0000 mg | Freq: Once | INTRAVENOUS | Status: AC
  Administered 2024-01-26: 4 mg via INTRAVENOUS
  Filled 2024-01-26: qty 1

## 2024-01-26 MED ORDER — ONDANSETRON HCL 4 MG/2ML IJ SOLN
4.0000 mg | Freq: Once | INTRAMUSCULAR | Status: AC
  Administered 2024-01-26: 4 mg via INTRAVENOUS
  Filled 2024-01-26: qty 2

## 2024-01-26 MED ORDER — ONDANSETRON 4 MG PO TBDP: 4.0000 mg | ORAL_TABLET | Freq: Three times a day (TID) | ORAL | 0 refills | Status: AC | PRN

## 2024-01-26 MED ORDER — IOHEXOL 300 MG/ML  SOLN
100.0000 mL | Freq: Once | INTRAMUSCULAR | Status: AC | PRN
  Administered 2024-01-26: 100 mL via INTRAVENOUS

## 2024-01-26 MED ORDER — HYDROCODONE-ACETAMINOPHEN 5-325 MG PO TABS: 1.0000 | ORAL_TABLET | Freq: Four times a day (QID) | ORAL | 0 refills | Status: DC | PRN

## 2024-01-26 NOTE — Discharge Instructions (Addendum)
 Please follow-up closely with urology on an outpatient basis for continued evaluation of your kidney stone.  Follow-up closely with a primary care doctor for continued monitoring of your abdominal aortic aneurysm.  Return to emergency department immediately for any new or worsening symptoms.  [facility] Primary Care Doctor List    Rollene Pesa, MD. Specialty: [facility] Medicine Contact information: [address]  [place] [address]  [phone]   Glendia Fielding, MD. Specialty: [facility] Medicine Contact information: [address]  [place] [address]  [phone]   Benita Outhouse, MD Specialty: Internal Medicine Contact information: [address] [place] [address]  [phone]   Darlyn Hurst, MD. Specialty: Internal Medicine Contact information: [address]  [place] [address]  [phone]    [facility] (Dr. Luke) Specialty: Family Medicine Contact information: [address]  [place] [address]  [phone]   Garnette Lolling, MD. Specialty: [facility] Medicine Contact information: [address]  [address]  [place] [address]  [phone]   Gaither Langton, MD. Specialty: Internal Medicine Contact information: [address]  [address]  [place] [address]  [phone]   [facility]: [address] [phone]  [place], Family medicine [address]  [phone]  [facility] [address] [place], [place] [phone]  [facility]: [address] [phone]    [facility] [place]  [address] [place], [place] [address] [phone]  Services [facility] offers a variety of basic health services.  Services include but are not limited to: Blood pressure checks  Heart rate checks  Blood sugar checks  Urine analysis  Rapid strep tests  Pregnancy tests.  Health education and  referrals  People needing more complex services will be directed to a physician online. Using these virtual visits, doctors can evaluate and prescribe medicine and treatments. There will be no medication on-site, though [facility] will help patients fill their prescriptions at little to no cost.   For More information please go to: [email or web address]  Allergy and Asthma:    [address] [facility] [address] [address] [facility] [address] [place] [phone]  Urology:  [address]  [place] [phone]  [facility]  [address] [place], [place] [phone]  Orthopedics   [address] [place], [place] [phone]  Endocrinology  [address] [place], [place] [phone]  Podiatry: [facility] [phone]

## 2024-01-26 NOTE — ED Provider Notes (Signed)
 [place] EMERGENCY DEPARTMENT AT [facility] Provider Note   CSN: [id number] Arrival date & time: 01/26/24  9094     Patient presents with: Abdominal Pain   Joshua Hardy is a 68 y.o. male.   Patient is a 68 year old male who presents emergency department with a chief complaint of right lower quadrant abdominal pain and right flank pain which has been ongoing since this morning.  Patient notes he did have some nausea and felt clammy but notes that this has resolved.  There was no associated vomiting.  Patient notes he does have a history of kidney stones but notes that this feels different than previous.  He denies any other previous abdominal surgeries.  He has had no recent falls or blunt abdominal wall trauma.  He denies any associated diarrhea or constipation.  He has had no testicular pain or tenderness.  He denies any dysuria or hematuria.   Abdominal Pain      Prior to Admission medications   Medication Sig Start Date End Date Taking? Authorizing Provider  HYDROcodone -acetaminophen  (NORCO/VICODIN) 5-325 MG tablet Take 1 tablet by mouth every 6 (six) hours as needed for severe pain (pain score 7-10). 01/26/24  Yes Yuktha Kerchner D, PA-C  ondansetron  (ZOFRAN -ODT) 4 MG disintegrating tablet Take 1 tablet (4 mg total) by mouth every 8 (eight) hours as needed for nausea or vomiting. 01/26/24  Yes Rozalia Dino, Lonni D, PA-C  tamsulosin  (FLOMAX ) 0.4 MG CAPS capsule Take 1 capsule (0.4 mg total) by mouth daily. 01/26/24  Yes Daralene Lonni BIRCH, PA-C  acetaminophen  (TYLENOL ) 650 MG CR tablet Take 650 mg by mouth every 8 (eight) hours as needed for pain.    [provider]  ALPRAZolam  (XANAX ) 0.5 MG tablet Take 1st tablet 1 hour prior to arrival and take 2nd tablet upon arrival 10/21/23   Brown, Fallon E, NP  atorvastatin (LIPITOR) 20 MG tablet Take 20 mg by mouth daily.    [provider]  furosemide (LASIX) 40 MG tablet Take 40 mg by mouth daily. 03/12/23    [provider]  Glucosamine 500 MG CAPS Take 500 mg by mouth daily as needed (joint pain).    [provider]  metFORMIN (GLUCOPHAGE) 500 MG tablet Take 500 mg by mouth 2 (two) times daily with a meal.    [provider]  OZEMPIC, 0.25 OR 0.5 MG/DOSE, 2 MG/3ML SOPN  07/05/23   [provider]    Allergies: Patient has no known allergies.    Review of Systems  Gastrointestinal:  Positive for abdominal pain.  All other systems reviewed and are negative.   Updated Vital Signs BP (!) 100/53   Pulse (!) 55   Temp 97.9 F (36.6 C) (Oral)   Resp 17   Ht 5' 7 (1.702 m)   Wt 131.5 kg   SpO2 94%   BMI 45.42 kg/m   Physical Exam Vitals and nursing note reviewed.  Constitutional:      Appearance: Normal appearance.  HENT:     Head: Normocephalic and atraumatic.     Nose: Nose normal.     Mouth/Throat:     Mouth: Mucous membranes are moist.   Eyes:     Extraocular Movements: Extraocular movements intact.     Conjunctiva/sclera: Conjunctivae normal.     Pupils: Pupils are equal, round, and reactive to light.    Cardiovascular:     Rate and Rhythm: Normal rate and regular rhythm.     Pulses: Normal pulses.  Heart sounds: Normal heart sounds. No murmur heard.    No gallop.  Pulmonary:     Effort: Pulmonary effort is normal. No respiratory distress.     Breath sounds: Normal breath sounds. No stridor. No wheezing, rhonchi or rales.  Abdominal:     General: Abdomen is flat. Bowel sounds are normal. There is no distension.     Palpations: Abdomen is soft.     Tenderness: There is abdominal tenderness in the right lower quadrant. There is no guarding. Negative signs include Murphy's sign.     Hernia: No hernia is present.   Musculoskeletal:        General: Normal range of motion.     Cervical back: Normal range of motion and neck supple.   Skin:    General: Skin is warm and dry.   Neurological:     General: No focal deficit present.      Mental Status: He is alert and oriented to person, place, and time. Mental status is at baseline.   Psychiatric:        Mood and Affect: Mood normal.        Behavior: Behavior normal.        Thought Content: Thought content normal.        Judgment: Judgment normal.     (all labs ordered are listed, but only abnormal results are displayed) Labs Reviewed  LIPASE, BLOOD - Abnormal; Notable for the following components:      Result Value   Lipase 119 (*)    All other components within normal limits  COMPREHENSIVE METABOLIC PANEL WITH GFR - Abnormal; Notable for the following components:   Glucose, Bld 135 (*)    BUN 27 (*)    Creatinine, Ser 1.55 (*)    GFR, Estimated 49 (*)    All other components within normal limits  CBC - Abnormal; Notable for the following components:   RBC 3.90 (*)    HCT 38.4 (*)    All other components within normal limits  URINALYSIS, ROUTINE W REFLEX MICROSCOPIC - Abnormal; Notable for the following components:   APPearance HAZY (*)    Hgb urine dipstick LARGE (*)    Protein, ur 30 (*)    All other components within normal limits    EKG: None  Radiology: CT ABDOMEN PELVIS W CONTRAST Result Date: 01/26/2024 CLINICAL DATA:  RLQ abdominal pain EXAM: CT ABDOMEN AND PELVIS WITH CONTRAST TECHNIQUE: Multidetector CT imaging of the abdomen and pelvis was performed using the standard protocol following bolus administration of intravenous contrast. RADIATION DOSE REDUCTION: This exam was performed according to the departmental dose-optimization program which includes automated exposure control, adjustment of the mA and/or kV according to patient size and/or use of iterative reconstruction technique. CONTRAST:  OMNIPAQUE IOHEXOL 300 MG/ML  SOLN COMPARISON:  10/17/2013 FINDINGS: Lower chest: No pleural or pericardial effusion. Scattered 3-vessel coronary calcifications and aortic calcifications. Visualized lung bases clear. Hepatobiliary: No focal liver  abnormality is seen. No gallstones, gallbladder wall thickening, or biliary dilatation. Pancreas: Unremarkable. No pancreatic ductal dilatation or surrounding inflammatory changes. Spleen: Normal in size without focal abnormality. Adrenals/Urinary Tract: No adrenal mass. Mild right hydronephrosis and ureterectasis down to the level of a 4 mm mid ureteral calculus at the level of the SI joint. There are mild inflammatory/edematous changes around the right kidney and proximal ureter. No other evident urolithiasis. Mild left renal parenchymal atrophy as before. Small left renal cortical cysts. Urinary bladder nondistended. Stomach/Bowel: Stomach is partially distended,  without acute finding. Small bowel decompressed. Normal appendix. The colon is relatively decompressed, without acute finding. Vascular/Lymphatic: Moderate aortoiliac calcified plaque with fusiform 3 cm infrarenal aneurysm terminating above the bifurcation. No abdominal or pelvic adenopathy. Reproductive: Prostate is unremarkable. Other: No ascites.  No free air. Musculoskeletal: Small paraumbilical hernia containing only mesenteric fat. Vertebral endplate spurring at multiple levels in the lower thoracic spine. IMPRESSION: 1. Obstructing 4 mm mid right ureteral calculus. 2. 3 cm infrarenal abdominal aortic aneurysm. Recommend follow-up every 3 years. 3. Coronary and aortic Atherosclerosis (ICD10-I70.0). Electronically Signed   By: JONETTA Faes M.D.   On: 01/26/2024 10:49     Procedures   Medications Ordered in the ED  morphine (PF) 4 MG/ML injection 4 mg (4 mg Intravenous Given 01/26/24 0950)  ondansetron  (ZOFRAN ) injection 4 mg (4 mg Intravenous Given 01/26/24 0950)  sodium chloride  0.9 % bolus 1,000 mL (1,000 mLs Intravenous Bolus 01/26/24 0950)  iohexol (OMNIPAQUE) 300 MG/ML solution 100 mL (100 mLs Intravenous Contrast Given 01/26/24 1030)                                    Medical Decision Making Amount and/or Complexity of Data  Reviewed Labs: ordered. Radiology: ordered.  Risk Prescription drug management.   This patient presents to the ED for concern of right lower quadrant abdominal pain and flank pain differential diagnosis includes acute appendicitis, cholecystitis, bowel obstruction, diverticulitis, testicular torsion, pyelonephritis, kidney stone, pancreatitis, mesenteric ischemia    Additional history obtained:  Additional history obtained from family External records from outside source obtained and reviewed including none   Lab Tests:  I Ordered, and personally interpreted labs.  The pertinent results include: No leukocytosis, no anemia, mildly elevated creatinine, no leukocytosis, no anemia, mild elevation of lipase, urinalysis with large hemoglobin   Imaging Studies ordered:  I ordered imaging studies including CT scan of abdomen pelvis I independently visualized and interpreted imaging which showed right sided mid ureteral stone, 3 cm infrarenal abdominal aortic aneurysm I agree with the radiologist interpretation   Medicines ordered and prescription drug management:  I ordered medication including morphine, Zofran , IV fluids for abdominal pain and flank pain Reevaluation of the patient after these medicines showed that the patient improved I have reviewed the patients home medicines and have made adjustments as needed   Problem List / ED Course:  Patient is doing well at this time and is stable for discharge home.  Discussed with patient that he does have a right sided kidney stone at this time.  Will plan for symptomatic treatment on outpatient basis.  He has no indication for urinary tract infection.  Patient does not actively meet sepsis criteria at this point.  Pain is well-controlled at the bedside and he is tolerating p.o. intake without difficulty.  He was made aware of the abdominal aortic aneurysm and the need for continued follow-up and monitoring of this.  He is asymptomatic in  this regard and does not warrant emergent surgical intervention.  Do not suspect that admission is warranted at this time.  The need for close follow-up with urology was discussed.  Strict turn precautions were discussed for any new or worsening symptoms.  Patient voiced understanding to the plan and had no additional questions.   Social Determinants of Health:  None        Final diagnoses:  Ureteral stone with hydronephrosis  Infrarenal abdominal aortic aneurysm (AAA) without  rupture [facility])    ED Discharge Orders          Ordered    HYDROcodone -acetaminophen  (NORCO/VICODIN) 5-325 MG tablet  Every 6 hours PRN        01/26/24 1103    ondansetron  (ZOFRAN -ODT) 4 MG disintegrating tablet  Every 8 hours PRN        01/26/24 1103    tamsulosin  (FLOMAX ) 0.4 MG CAPS capsule  Daily        01/26/24 1103               Daralene Lonni BIRCH, PA-C 01/26/24 1106    Suzette Pac, MD 01/27/24 [phone]

## 2024-01-26 NOTE — ED Triage Notes (Signed)
 Pt c/o right lower pain that started today, with feeling clammy and nauseous.
